# Patient Record
Sex: Male | Born: 1960 | Race: White | Hispanic: No | Marital: Single | Smoking: Current every day smoker
Health system: Southern US, Community
[De-identification: ages and names within clinical notes are randomized; demographics above are authoritative.]

## PROBLEM LIST (undated history)

## (undated) DIAGNOSIS — F329 Major depressive disorder, single episode, unspecified: Secondary | ICD-10-CM

## (undated) DIAGNOSIS — F32A Depression, unspecified: Secondary | ICD-10-CM

## (undated) DIAGNOSIS — B009 Herpesviral infection, unspecified: Secondary | ICD-10-CM

## (undated) DIAGNOSIS — F419 Anxiety disorder, unspecified: Secondary | ICD-10-CM

## (undated) DIAGNOSIS — K219 Gastro-esophageal reflux disease without esophagitis: Secondary | ICD-10-CM

## (undated) HISTORY — DX: Herpesviral infection, unspecified: B00.9

## (undated) HISTORY — PX: TONSILLECTOMY: SUR1361

## (undated) HISTORY — DX: Major depressive disorder, single episode, unspecified: F32.9

## (undated) HISTORY — DX: Depression, unspecified: F32.A

## (undated) HISTORY — DX: Gastro-esophageal reflux disease without esophagitis: K21.9

## (undated) HISTORY — PX: WISDOM TOOTH EXTRACTION: SHX21

## (undated) HISTORY — DX: Anxiety disorder, unspecified: F41.9

---

## 1999-08-10 HISTORY — PX: SHOULDER SURGERY: SHX246

## 2015-02-11 ENCOUNTER — Other Ambulatory Visit (HOSPITAL_COMMUNITY): Payer: Self-pay | Admitting: Orthopedic Surgery

## 2015-02-11 DIAGNOSIS — S335XXA Sprain of ligaments of lumbar spine, initial encounter: Secondary | ICD-10-CM

## 2015-02-14 ENCOUNTER — Emergency Department (HOSPITAL_COMMUNITY): Payer: 59

## 2015-02-14 ENCOUNTER — Inpatient Hospital Stay (HOSPITAL_COMMUNITY)
Admission: EM | Admit: 2015-02-14 | Discharge: 2015-02-17 | DRG: 184 | Disposition: A | Discharge status: Home / Self Care | Payer: 59 | Attending: General Surgery | Admitting: General Surgery

## 2015-02-14 ENCOUNTER — Encounter (HOSPITAL_COMMUNITY): Payer: Self-pay | Admitting: Emergency Medicine

## 2015-02-14 DIAGNOSIS — F17210 Nicotine dependence, cigarettes, uncomplicated: Secondary | ICD-10-CM | POA: Diagnosis present

## 2015-02-14 DIAGNOSIS — S2243XA Multiple fractures of ribs, bilateral, initial encounter for closed fracture: Secondary | ICD-10-CM | POA: Diagnosis not present

## 2015-02-14 DIAGNOSIS — F1110 Opioid abuse, uncomplicated: Secondary | ICD-10-CM | POA: Diagnosis present

## 2015-02-14 DIAGNOSIS — S27322A Contusion of lung, bilateral, initial encounter: Secondary | ICD-10-CM | POA: Diagnosis present

## 2015-02-14 DIAGNOSIS — S2249XA Multiple fractures of ribs, unspecified side, initial encounter for closed fracture: Secondary | ICD-10-CM | POA: Diagnosis present

## 2015-02-14 DIAGNOSIS — S2220XA Unspecified fracture of sternum, initial encounter for closed fracture: Secondary | ICD-10-CM | POA: Diagnosis present

## 2015-02-14 DIAGNOSIS — S27329A Contusion of lung, unspecified, initial encounter: Secondary | ICD-10-CM

## 2015-02-14 DIAGNOSIS — S2232XA Fracture of one rib, left side, initial encounter for closed fracture: Secondary | ICD-10-CM

## 2015-02-14 DIAGNOSIS — R0781 Pleurodynia: Secondary | ICD-10-CM | POA: Diagnosis not present

## 2015-02-14 DIAGNOSIS — F1190 Opioid use, unspecified, uncomplicated: Secondary | ICD-10-CM | POA: Diagnosis present

## 2015-02-14 LAB — CBC
HEMATOCRIT: 44 % (ref 39.0–52.0)
HEMOGLOBIN: 15.1 g/dL (ref 13.0–17.0)
MCH: 31.4 pg (ref 26.0–34.0)
MCHC: 34.3 g/dL (ref 30.0–36.0)
MCV: 91.5 fL (ref 78.0–100.0)
PLATELETS: 164 10*3/uL (ref 150–400)
RBC: 4.81 MIL/uL (ref 4.22–5.81)
RDW: 12.7 % (ref 11.5–15.5)
WBC: 13.5 10*3/uL — ABNORMAL HIGH (ref 4.0–10.5)

## 2015-02-14 LAB — MRSA PCR SCREENING: MRSA by PCR: NEGATIVE

## 2015-02-14 LAB — I-STAT CHEM 8, ED
BUN: 17 mg/dL (ref 6–20)
Calcium, Ion: 1.13 mmol/L (ref 1.12–1.23)
Chloride: 105 mmol/L (ref 101–111)
Creatinine, Ser: 1.2 mg/dL (ref 0.61–1.24)
Glucose, Bld: 120 mg/dL — ABNORMAL HIGH (ref 65–99)
HEMATOCRIT: 46 % (ref 39.0–52.0)
HEMOGLOBIN: 15.6 g/dL (ref 13.0–17.0)
POTASSIUM: 5 mmol/L (ref 3.5–5.1)
Sodium: 141 mmol/L (ref 135–145)
TCO2: 22 mmol/L (ref 0–100)

## 2015-02-14 LAB — PREPARE FRESH FROZEN PLASMA
UNIT DIVISION: 0
Unit division: 0

## 2015-02-14 LAB — TYPE AND SCREEN
ABO/RH(D): A POS
Antibody Screen: NEGATIVE
UNIT DIVISION: 0
Unit division: 0

## 2015-02-14 LAB — COMPREHENSIVE METABOLIC PANEL
ALK PHOS: 132 U/L — AB (ref 38–126)
ALT: 42 U/L (ref 17–63)
AST: 53 U/L — ABNORMAL HIGH (ref 15–41)
Albumin: 3.4 g/dL — ABNORMAL LOW (ref 3.5–5.0)
Anion gap: 6 (ref 5–15)
BUN: 13 mg/dL (ref 6–20)
CALCIUM: 8.7 mg/dL — AB (ref 8.9–10.3)
CHLORIDE: 106 mmol/L (ref 101–111)
CO2: 26 mmol/L (ref 22–32)
Creatinine, Ser: 1.35 mg/dL — ABNORMAL HIGH (ref 0.61–1.24)
GFR calc Af Amer: >60 mL/min (ref >60)
GFR, EST NON AFRICAN AMERICAN: 59 mL/min — AB (ref >60)
GLUCOSE: 123 mg/dL — AB (ref 65–99)
Potassium: 5 mmol/L (ref 3.5–5.1)
Sodium: 138 mmol/L (ref 135–145)
Total Bilirubin: 0.8 mg/dL (ref 0.3–1.2)
Total Protein: 6.8 g/dL (ref 6.5–8.1)

## 2015-02-14 LAB — I-STAT CG4 LACTIC ACID, ED: Lactic Acid, Venous: 1.26 mmol/L (ref 0.5–2.0)

## 2015-02-14 LAB — PROTIME-INR
INR: 1.23 (ref 0.00–1.49)
PROTHROMBIN TIME: 15.6 s — AB (ref 11.6–15.2)

## 2015-02-14 LAB — ETHANOL: Alcohol, Ethyl (B): <5 mg/dL (ref <5)

## 2015-02-14 LAB — ABO/RH: ABO/RH(D): A POS

## 2015-02-14 LAB — CDS SEROLOGY

## 2015-02-14 MED ORDER — PANTOPRAZOLE SODIUM 40 MG PO TBEC
40.0000 mg | DELAYED_RELEASE_TABLET | Freq: Every day | ORAL | Status: DC
  Administered 2015-02-14 – 2015-02-16 (×2): 40 mg via ORAL
  Filled 2015-02-14 (×2): qty 1

## 2015-02-14 MED ORDER — ETOMIDATE 2 MG/ML IV SOLN
INTRAVENOUS | Status: AC
  Filled 2015-02-14: qty 20

## 2015-02-14 MED ORDER — PANTOPRAZOLE SODIUM 40 MG IV SOLR
40.0000 mg | Freq: Every day | INTRAVENOUS | Status: DC
  Administered 2015-02-15: 40 mg via INTRAVENOUS
  Filled 2015-02-14 (×2): qty 40

## 2015-02-14 MED ORDER — FENTANYL CITRATE (PF) 100 MCG/2ML IJ SOLN
INTRAMUSCULAR | Status: DC | PRN
  Administered 2015-02-14: 100 ug via INTRAVENOUS

## 2015-02-14 MED ORDER — ROCURONIUM BROMIDE 50 MG/5ML IV SOLN
INTRAVENOUS | Status: AC
  Filled 2015-02-14: qty 2

## 2015-02-14 MED ORDER — NALOXONE HCL 0.4 MG/ML IJ SOLN: 0.4000 mg | INTRAMUSCULAR | Status: DC | PRN

## 2015-02-14 MED ORDER — OXYCODONE HCL 5 MG PO TABS
5.0000 mg | ORAL_TABLET | ORAL | Status: DC | PRN
  Filled 2015-02-14: qty 1

## 2015-02-14 MED ORDER — OXYCODONE HCL 5 MG PO TABS: 10.0000 mg | ORAL_TABLET | ORAL | Status: DC | PRN

## 2015-02-14 MED ORDER — LIDOCAINE HCL (CARDIAC) 20 MG/ML IV SOLN
INTRAVENOUS | Status: AC
  Filled 2015-02-14: qty 5

## 2015-02-14 MED ORDER — DIPHENHYDRAMINE HCL 12.5 MG/5ML PO ELIX
12.5000 mg | ORAL_SOLUTION | Freq: Four times a day (QID) | ORAL | Status: DC | PRN
  Filled 2015-02-14: qty 5

## 2015-02-14 MED ORDER — SUCCINYLCHOLINE CHLORIDE 20 MG/ML IJ SOLN
INTRAMUSCULAR | Status: AC
  Filled 2015-02-14: qty 1

## 2015-02-14 MED ORDER — DIPHENHYDRAMINE HCL 50 MG/ML IJ SOLN: 12.5000 mg | Freq: Four times a day (QID) | INTRAMUSCULAR | Status: DC | PRN

## 2015-02-14 MED ORDER — HYDROMORPHONE 0.3 MG/ML IV SOLN
INTRAVENOUS | Status: DC
  Administered 2015-02-15: 5.1 mg via INTRAVENOUS
  Administered 2015-02-15: 4.8 mg via INTRAVENOUS
  Administered 2015-02-15: 09:00:00 via INTRAVENOUS
  Administered 2015-02-15: 3.6 mg via INTRAVENOUS
  Administered 2015-02-15: via INTRAVENOUS
  Administered 2015-02-15: 1.5 mg via INTRAVENOUS
  Administered 2015-02-15: 17:00:00 via INTRAVENOUS
  Administered 2015-02-15: 2.7 mg via INTRAVENOUS
  Administered 2015-02-16: 3 mg via INTRAVENOUS
  Administered 2015-02-16: 3.43 mg via INTRAVENOUS
  Administered 2015-02-16: 0.3 mg via INTRAVENOUS
  Administered 2015-02-16: 3 mg via INTRAVENOUS
  Filled 2015-02-14 (×4): qty 25

## 2015-02-14 MED ORDER — SODIUM CHLORIDE 0.9 % IV SOLN
INTRAVENOUS | Status: DC
  Administered 2015-02-14 – 2015-02-15 (×2): via INTRAVENOUS

## 2015-02-14 MED ORDER — SODIUM CHLORIDE 0.9 % IV SOLN
INTRAVENOUS | Status: DC | PRN
  Administered 2015-02-14: 1000 mL via INTRAVENOUS

## 2015-02-14 MED ORDER — ONDANSETRON HCL 4 MG/2ML IJ SOLN: 4.0000 mg | Freq: Four times a day (QID) | INTRAMUSCULAR | Status: DC | PRN

## 2015-02-14 MED ORDER — SODIUM CHLORIDE 0.9 % IJ SOLN: 9.0000 mL | INTRAMUSCULAR | Status: DC | PRN

## 2015-02-14 MED ORDER — FENTANYL CITRATE (PF) 100 MCG/2ML IJ SOLN: 100.0000 ug | Freq: Once | INTRAMUSCULAR | Status: AC

## 2015-02-14 MED ORDER — ENOXAPARIN SODIUM 40 MG/0.4ML ~~LOC~~ SOLN
40.0000 mg | SUBCUTANEOUS | Status: DC
Start: 2015-02-15 — End: 2015-02-15
  Filled 2015-02-14 (×3): qty 0.4
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
## 2015-02-14 NOTE — ED Notes (Signed)
Patient is resting comfortably. 
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
## 2015-02-14 NOTE — Progress Notes (Signed)
Chaplain responded to ED/Trauma B    02/14/15 2000  Clinical Encounter Type  Visited With Family;Health care provider  Visit Type Initial;Spiritual support  Referral From Other (Comment)  Spiritual Encounters  Spiritual Needs Emotional

## 2015-02-14 NOTE — ED Notes (Signed)
Family at bedside. 

## 2015-02-14 NOTE — ED Notes (Signed)
Transported to SDU by Everlena CooperMica, RN
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
## 2015-02-15 ENCOUNTER — Encounter (HOSPITAL_COMMUNITY): Payer: Self-pay | Admitting: Anesthesiology

## 2015-02-15 ENCOUNTER — Encounter (HOSPITAL_COMMUNITY): Payer: Self-pay | Admitting: *Deleted

## 2015-02-15 ENCOUNTER — Inpatient Hospital Stay (HOSPITAL_COMMUNITY): Payer: 59 | Admitting: Anesthesiology

## 2015-02-15 DIAGNOSIS — F1110 Opioid abuse, uncomplicated: Secondary | ICD-10-CM | POA: Diagnosis present

## 2015-02-15 DIAGNOSIS — S27322A Contusion of lung, bilateral, initial encounter: Secondary | ICD-10-CM | POA: Diagnosis present

## 2015-02-15 DIAGNOSIS — F17210 Nicotine dependence, cigarettes, uncomplicated: Secondary | ICD-10-CM | POA: Diagnosis present

## 2015-02-15 DIAGNOSIS — R0781 Pleurodynia: Secondary | ICD-10-CM | POA: Diagnosis present

## 2015-02-15 DIAGNOSIS — S2249XA Multiple fractures of ribs, unspecified side, initial encounter for closed fracture: Secondary | ICD-10-CM | POA: Diagnosis present

## 2015-02-15 DIAGNOSIS — S2243XA Multiple fractures of ribs, bilateral, initial encounter for closed fracture: Secondary | ICD-10-CM | POA: Diagnosis present

## 2015-02-15 DIAGNOSIS — S2220XA Unspecified fracture of sternum, initial encounter for closed fracture: Secondary | ICD-10-CM | POA: Diagnosis present

## 2015-02-15 LAB — BASIC METABOLIC PANEL
Anion gap: 8 (ref 5–15)
BUN: 8 mg/dL (ref 6–20)
CO2: 26 mmol/L (ref 22–32)
CREATININE: 0.95 mg/dL (ref 0.61–1.24)
Calcium: 8.3 mg/dL — ABNORMAL LOW (ref 8.9–10.3)
Chloride: 102 mmol/L (ref 101–111)
GFR calc Af Amer: >60 mL/min (ref >60)
GFR calc non Af Amer: >60 mL/min (ref >60)
Glucose, Bld: 102 mg/dL — ABNORMAL HIGH (ref 65–99)
Potassium: 3.5 mmol/L (ref 3.5–5.1)
Sodium: 136 mmol/L (ref 135–145)

## 2015-02-15 LAB — CBC
HCT: 40.7 % (ref 39.0–52.0)
HEMOGLOBIN: 13.7 g/dL (ref 13.0–17.0)
MCH: 30.9 pg (ref 26.0–34.0)
MCHC: 33.7 g/dL (ref 30.0–36.0)
MCV: 91.9 fL (ref 78.0–100.0)
PLATELETS: 166 10*3/uL (ref 150–400)
RBC: 4.43 MIL/uL (ref 4.22–5.81)
RDW: 13 % (ref 11.5–15.5)
WBC: 10.5 10*3/uL (ref 4.0–10.5)

## 2015-02-15 MED ORDER — KETAMINE HCL 10 MG/ML IJ SOLN
INTRAMUSCULAR | Status: DC | PRN
  Administered 2015-02-15: 20 mg via INTRAVENOUS
  Administered 2015-02-15: 10 mg via INTRAVENOUS

## 2015-02-15 MED ORDER — ROPIVACAINE HCL 2 MG/ML IJ SOLN
8.0000 mL/h | INTRAMUSCULAR | Status: DC
  Administered 2015-02-16: 8 mL/h via EPIDURAL
  Filled 2015-02-15 (×2): qty 200

## 2015-02-15 MED ORDER — PNEUMOCOCCAL VAC POLYVALENT 25 MCG/0.5ML IJ INJ
0.5000 mL | INJECTION | INTRAMUSCULAR | Status: AC
  Administered 2015-02-16: 0.5 mL via INTRAMUSCULAR
  Filled 2015-02-15: qty 0.5

## 2015-02-15 MED ORDER — CETYLPYRIDINIUM CHLORIDE 0.05 % MT LIQD
7.0000 mL | Freq: Two times a day (BID) | OROMUCOSAL | Status: DC
  Administered 2015-02-15 – 2015-02-16 (×2): 7 mL via OROMUCOSAL

## 2015-02-15 MED ORDER — KETAMINE HCL 100 MG/ML IJ SOLN
INTRAMUSCULAR | Status: AC
  Filled 2015-02-15: qty 1

## 2015-02-15 MED ORDER — FENTANYL CITRATE (PF) 100 MCG/2ML IJ SOLN
INTRAMUSCULAR | Status: AC
  Administered 2015-02-15: 100 ug
  Filled 2015-02-15: qty 4

## 2015-02-15 MED ORDER — ROPIVACAINE HCL 2 MG/ML IJ SOLN
6.0000 mL/h | INTRAMUSCULAR | Status: DC
  Administered 2015-02-15: 6 mL/h via EPIDURAL
  Filled 2015-02-15: qty 200

## 2015-02-15 MED ORDER — MIDAZOLAM HCL 2 MG/2ML IJ SOLN
INTRAMUSCULAR | Status: AC
Start: 2015-02-15 — End: 2015-02-15
  Administered 2015-02-15: 2 mg
  Filled 2015-02-15: qty 4

## 2015-02-15 MED ORDER — CHLORHEXIDINE GLUCONATE 0.12 % MT SOLN
15.0000 mL | Freq: Two times a day (BID) | OROMUCOSAL | Status: DC
  Administered 2015-02-15 – 2015-02-16 (×2): 15 mL via OROMUCOSAL
  Filled 2015-02-15 (×4): qty 15

## 2015-02-15 MED ORDER — LIDOCAINE HCL (PF) 1 % IJ SOLN
INTRAMUSCULAR | Status: DC | PRN
  Administered 2015-02-15: 2 mL via EPIDURAL
  Administered 2015-02-15: 5 mL
  Administered 2015-02-15: 3 mL

## 2015-02-15 NOTE — Anesthesia Procedure Notes (Addendum)
Epidural Patient location during procedure: ICU Start time: 02/15/2015 1:30 PM End time: 02/15/2015 1:58 PM  Staffing Anesthesiologist: Marcene DuosFITZGERALD, Pancho Rushing Performed by: anesthesiologist   Preanesthetic Checklist Completed: patient identified, site marked, surgical consent, pre-op evaluation, timeout performed, IV checked, risks and benefits discussed and monitors and equipment checked  Epidural Patient position: right lateral decubitus Prep: DuraPrep Patient monitoring: heart rate, cardiac monitor, continuous pulse ox and blood pressure Approach: right paramedian Location: thoracic (1-12) (T6-T7 interspace) Injection technique: LOR saline  Needle:  Needle type: Tuohy  Needle gauge: 17 G Needle length: 9 cm Needle insertion depth: 5 cm Catheter type: closed end flexible Catheter size: 19 Gauge Catheter at skin depth: 10 cm Test dose: Other (1% Lidocaine)  Assessment Events: blood not aspirated  Additional Notes Risks and benefits discussed with pt. Pt prepped and draped in sterile fashion. Skin anesthetized with 2% lidocaine and touhy advanced and lamina encountered at 3cm. Needle advanced and LOR at 5cm. Catheter threaded easily to 10cm at skin. Negative SA test dose with 2cc's 1% lidocaine. After five minutes pt able to move BLE and arms and additional 5cc's injected for negative IV test dose. Pt tolerated procedure well with no immediate complications.Reason for block:procedure for pain
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
## 2015-02-15 NOTE — Progress Notes (Signed)
Pt transferred from the ED around 2145, admitted to Rm/3s10. Pt is alert and oriented x4 but very drowsy. Fiine red rash noted to bilateral upper and lower extremities also has abrasion to left forearm dressed with Tegaderm. Placed on telemetry, currently NSR. Oriented to room, instructed to call for assistance before getting out of bed. Bed alarm on, educated about use of PCA at time of setup. Resting comfortably at this time, will continue to monitor

## 2015-02-15 NOTE — Progress Notes (Signed)
PT Cancellation Note  Patient Details Name: Carlos Johnson MRN: [id number] DOB: [birth year]   Cancelled Treatment:    Reason Eval/Treat Not Completed: Medical issues which prohibited therapy (Pt in pain.  For epidural this am per nurse/pt. Will check back in am.)   [name], Carlos Johnson 02/15/2015, 10:33 AM Eber Jonesawn Tamila Gaulin,PT Acute Rehabilitation [phone] (pager)

## 2015-02-16 ENCOUNTER — Inpatient Hospital Stay (HOSPITAL_COMMUNITY): Payer: 59

## 2015-02-16 MED ORDER — WHITE PETROLATUM GEL
Status: AC
  Administered 2015-02-16: 0.2
  Filled 2015-02-16: qty 1

## 2015-02-16 MED ORDER — OXYCODONE HCL 5 MG PO TABS
20.0000 mg | ORAL_TABLET | ORAL | Status: DC | PRN
Start: 2015-02-16 — End: 2015-02-17
  Administered 2015-02-16 – 2015-02-17 (×6): 20 mg via ORAL
  Filled 2015-02-16 (×6): qty 4

## 2015-02-16 MED ORDER — KETOROLAC TROMETHAMINE 30 MG/ML IJ SOLN
30.0000 mg | Freq: Once | INTRAMUSCULAR | Status: AC
  Administered 2015-02-16: 30 mg via INTRAVENOUS

## 2015-02-16 MED ORDER — KETOROLAC TROMETHAMINE 15 MG/ML IJ SOLN
15.0000 mg | Freq: Four times a day (QID) | INTRAMUSCULAR | Status: DC
  Administered 2015-02-16 – 2015-02-17 (×3): 15 mg via INTRAVENOUS
  Filled 2015-02-16 (×3): qty 1

## 2015-02-16 MED ORDER — KETOROLAC TROMETHAMINE 30 MG/ML IJ SOLN
INTRAMUSCULAR | Status: AC
  Filled 2015-02-16: qty 1

## 2015-02-16 MED ORDER — HYDROMORPHONE HCL 1 MG/ML IJ SOLN
0.5000 mg | INTRAMUSCULAR | Status: DC | PRN
  Administered 2015-02-16: 2 mg via INTRAVENOUS
  Administered 2015-02-16: 1 mg via INTRAVENOUS
  Administered 2015-02-17 (×2): 2 mg via INTRAVENOUS
  Filled 2015-02-16 (×2): qty 2
  Filled 2015-02-16: qty 1
  Filled 2015-02-16: qty 2

## 2015-02-16 MED ORDER — NICOTINE 21 MG/24HR TD PT24
21.0000 mg | MEDICATED_PATCH | TRANSDERMAL | Status: DC
  Administered 2015-02-16: 21 mg via TRANSDERMAL
  Filled 2015-02-16: qty 1

## 2015-02-16 NOTE — Progress Notes (Signed)
Called around 6 pm to report that epidural had come apart at the tubing connection.  I was unable to get to the room until now but was able to DC it as it was essentially hanging in the subq tissue. Band Aid placed. Tolerated well.
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
## 2015-02-16 NOTE — Progress Notes (Addendum)
Report called to Woodland Heightshristy, RN on 6N. Patient to be transferred to 6N12 on tele via bed by Jennette Kettleeanna, RN and [place], [place]. Belongings sent with patient. Per patient's request, patient's sister, Pervis HockingSusan Smith, notified of move to new room.

## 2015-02-16 NOTE — Progress Notes (Signed)
Dilaudid PCA discontinued per MD order.

## 2015-02-16 NOTE — Progress Notes (Signed)
While assessing patient at 2015, found that patient's epidural catheter was disconnected from the medication tubing.  Dr. Ivin Bootyrews notified.  He will be up to see patient
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
## 2015-02-17 ENCOUNTER — Encounter: Payer: Self-pay | Admitting: Orthopedic Surgery

## 2015-02-17 DIAGNOSIS — S27322A Contusion of lung, bilateral, initial encounter: Secondary | ICD-10-CM | POA: Diagnosis present

## 2015-02-17 DIAGNOSIS — S2220XA Unspecified fracture of sternum, initial encounter for closed fracture: Secondary | ICD-10-CM | POA: Diagnosis present

## 2015-02-17 DIAGNOSIS — F1190 Opioid use, unspecified, uncomplicated: Secondary | ICD-10-CM | POA: Diagnosis present

## 2015-02-17 LAB — BLOOD PRODUCT ORDER (VERBAL) VERIFICATION

## 2015-02-17 MED ORDER — HYDROMORPHONE HCL 1 MG/ML IJ SOLN
0.5000 mg | INTRAMUSCULAR | Status: DC | PRN
Start: 2015-02-17 — End: 2015-02-17

## 2015-02-17 MED ORDER — NAPROXEN 500 MG PO TABS: 500.0000 mg | ORAL_TABLET | Freq: Two times a day (BID) | ORAL | Status: DC

## 2015-02-17 MED ORDER — TRAMADOL HCL 50 MG PO TABS
100.0000 mg | ORAL_TABLET | Freq: Four times a day (QID) | ORAL | Status: DC
  Administered 2015-02-17 (×3): 100 mg via ORAL
  Filled 2015-02-17 (×3): qty 2

## 2015-02-17 MED ORDER — IOHEXOL 300 MG/ML  SOLN
100.0000 mL | Freq: Once | INTRAMUSCULAR | Status: AC | PRN
  Administered 2015-02-17: 100 mL via INTRAVENOUS

## 2015-02-17 MED ORDER — TRAMADOL HCL 50 MG PO TABS: 100.0000 mg | ORAL_TABLET | Freq: Four times a day (QID) | ORAL | Status: DC

## 2015-02-17 MED ORDER — OXYCODONE-ACETAMINOPHEN 10-325 MG PO TABS: 1.0000 | ORAL_TABLET | ORAL | Status: DC | PRN

## 2015-02-17 MED ORDER — NAPROXEN 250 MG PO TABS
500.0000 mg | ORAL_TABLET | Freq: Two times a day (BID) | ORAL | Status: DC
  Administered 2015-02-17 (×2): 500 mg via ORAL
  Filled 2015-02-17 (×2): qty 2
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
## 2015-02-17 NOTE — Discharge Instructions (Signed)
Increase activity as pain allows. ° °No driving while taking oxycodone. °
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
## 2015-02-17 NOTE — Progress Notes (Signed)
Paged Dr. Lindie SpruceWyatt last night around 2200 because patient complaining of pain unrelieved by 2 mg Dilaudid given at 2017 and 20 mg Oxycodone at 2139.  No new orders at this time.

## 2015-02-17 NOTE — Progress Notes (Signed)
Carlos Johnson to be D/C'd Home per MD order.  Discussed with the patient and all questions fully answered.  VSS,IV catheters discontinued intact. Site without signs and symptoms of complications. Dressing and pressure applied.  An After Visit Summary was printed and given to the patient. Patient received prescription. Smoking cessation information given. Pt reports he is not wanting to stop smoking at this time.   D/c education completed with patient including follow up instructions, medication list, d/c activities limitations if indicated, with other d/c instructions as indicated by MD - patient able to verbalize understanding, all questions fully answered.   Patient instructed to return to ED, call 911, or call MD for any changes in condition.   Patient is waiting on a ride home and will be discharged home once someone is available. Pt has spoken to his mother and a friend who is working things out about his transportation.    Sherene Siresillman, Anetra Czerwinski Jo 02/17/2015 5:16 PM
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
## 2015-02-19 ENCOUNTER — Ambulatory Visit (HOSPITAL_COMMUNITY): Payer: 59

## 2015-02-27 ENCOUNTER — Other Ambulatory Visit (HOSPITAL_COMMUNITY): Payer: Self-pay

## 2015-02-28 ENCOUNTER — Ambulatory Visit (HOSPITAL_COMMUNITY): Payer: 59

## 2015-02-28 MED ORDER — OXYCODONE-ACETAMINOPHEN 10-325 MG PO TABS: 1.0000 | ORAL_TABLET | ORAL | Status: DC | PRN

## 2015-02-28 NOTE — Telephone Encounter (Signed)
Pt called for more pain medication, said he had to go to ED at [facility] due to spider bite and used medicine more quickly. I did confirm that he was seen twice at [facility] ED. Will write for Perc 10/325, #20.

## 2015-03-01 ENCOUNTER — Encounter: Payer: Self-pay | Admitting: General Surgery
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
## 2015-03-02 ENCOUNTER — Emergency Department (INDEPENDENT_AMBULATORY_CARE_PROVIDER_SITE_OTHER)
Admission: EM | Admit: 2015-03-02 | Discharge: 2015-03-02 | Disposition: A | Discharge status: Home / Self Care | Payer: 59 | Source: Home / Self Care | Attending: Emergency Medicine | Admitting: Emergency Medicine

## 2015-03-02 ENCOUNTER — Ambulatory Visit: Payer: 59

## 2015-03-02 ENCOUNTER — Encounter (HOSPITAL_COMMUNITY): Payer: Self-pay | Admitting: Emergency Medicine

## 2015-03-02 DIAGNOSIS — S2239XA Fracture of one rib, unspecified side, initial encounter for closed fracture: Secondary | ICD-10-CM

## 2015-03-02 DIAGNOSIS — Z5189 Encounter for other specified aftercare: Secondary | ICD-10-CM | POA: Diagnosis not present

## 2015-03-02 MED ORDER — OXYCODONE-ACETAMINOPHEN 10-325 MG PO TABS: 1.0000 | ORAL_TABLET | ORAL | Status: DC | PRN

## 2015-03-02 NOTE — Discharge Instructions (Signed)
Use the pain medicine every 4 hours as needed. Follow-up with [facility] or your PCP for additional refills.  Do wet to dry dressings on your hand twice a day. There is no sign of infection currently. You do not need to use any creams. Follow-up with the wound care center as scheduled next month. If things are getting worse, please go to the emergency room.
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
## 2015-09-03 ENCOUNTER — Encounter (HOSPITAL_COMMUNITY): Payer: Self-pay | Admitting: *Deleted

## 2015-09-03 ENCOUNTER — Emergency Department (HOSPITAL_COMMUNITY)
Admission: EM | Admit: 2015-09-03 | Discharge: 2015-09-03 | Disposition: A | Discharge status: Home / Self Care | Payer: Self-pay | Attending: Emergency Medicine | Admitting: Emergency Medicine

## 2015-09-03 DIAGNOSIS — Y9389 Activity, other specified: Secondary | ICD-10-CM | POA: Insufficient documentation

## 2015-09-03 DIAGNOSIS — F17210 Nicotine dependence, cigarettes, uncomplicated: Secondary | ICD-10-CM | POA: Insufficient documentation

## 2015-09-03 DIAGNOSIS — Y9289 Other specified places as the place of occurrence of the external cause: Secondary | ICD-10-CM | POA: Insufficient documentation

## 2015-09-03 DIAGNOSIS — Y998 Other external cause status: Secondary | ICD-10-CM | POA: Insufficient documentation

## 2015-09-03 DIAGNOSIS — X58XXXA Exposure to other specified factors, initial encounter: Secondary | ICD-10-CM | POA: Insufficient documentation

## 2015-09-03 DIAGNOSIS — T401X1A Poisoning by heroin, accidental (unintentional), initial encounter: Secondary | ICD-10-CM | POA: Insufficient documentation

## 2015-09-03 LAB — CBC WITH DIFFERENTIAL/PLATELET
Basophils Absolute: 0 10*3/uL (ref 0.0–0.1)
Basophils Relative: 0 %
Eosinophils Absolute: 0.3 10*3/uL (ref 0.0–0.7)
Eosinophils Relative: 3 %
HEMATOCRIT: 45.5 % (ref 39.0–52.0)
Hemoglobin: 15.6 g/dL (ref 13.0–17.0)
LYMPHS PCT: 36 %
Lymphs Abs: 3.8 10*3/uL (ref 0.7–4.0)
MCH: 31.3 pg (ref 26.0–34.0)
MCHC: 34.3 g/dL (ref 30.0–36.0)
MCV: 91.4 fL (ref 78.0–100.0)
MONOS PCT: 9 %
Monocytes Absolute: 1 10*3/uL (ref 0.1–1.0)
NEUTROS ABS: 5.5 10*3/uL (ref 1.7–7.7)
Neutrophils Relative %: 52 %
Platelets: 255 10*3/uL (ref 150–400)
RBC: 4.98 MIL/uL (ref 4.22–5.81)
RDW: 13.6 % (ref 11.5–15.5)
WBC: 10.6 10*3/uL — ABNORMAL HIGH (ref 4.0–10.5)

## 2015-09-03 LAB — BASIC METABOLIC PANEL
ANION GAP: 13 (ref 5–15)
BUN: 9 mg/dL (ref 6–20)
CALCIUM: 9.1 mg/dL (ref 8.9–10.3)
CO2: 22 mmol/L (ref 22–32)
Chloride: 101 mmol/L (ref 101–111)
Creatinine, Ser: 0.94 mg/dL (ref 0.61–1.24)
GFR calc Af Amer: >60 mL/min (ref >60)
GFR calc non Af Amer: >60 mL/min (ref >60)
GLUCOSE: 112 mg/dL — AB (ref 65–99)
Potassium: 3.4 mmol/L — ABNORMAL LOW (ref 3.5–5.1)
Sodium: 136 mmol/L (ref 135–145)

## 2015-09-03 LAB — CBG MONITORING, ED: Glucose-Capillary: 112 mg/dL — ABNORMAL HIGH (ref 65–99)

## 2015-09-03 MED ORDER — POTASSIUM CHLORIDE CRYS ER 20 MEQ PO TBCR
30.0000 meq | EXTENDED_RELEASE_TABLET | Freq: Once | ORAL | Status: AC
  Administered 2015-09-03: 30 meq via ORAL
  Filled 2015-09-03: qty 2
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
## 2015-09-03 NOTE — ED Notes (Signed)
Patient removed nasal airway himself.

## 2015-09-03 NOTE — Discharge Instructions (Signed)
Accidental Overdose °A drug overdose occurs when a chemical substance (drug or medication) is used in amounts large enough to overcome a person. This may result in severe illness or death. This is a type of poisoning. Accidental overdoses of medications or other substances come from a variety of reasons. When this happens accidentally, it is often because the person taking the substance does not know enough about what they have taken. Drugs which commonly cause overdose deaths are alcohol, psychotropic medications (medications which affect the mind), pain medications, illegal drugs (street drugs) such as cocaine and heroin, and multiple drugs taken at the same time. It may result from careless behavior (such as over-indulging at a party). Other causes of overdose may include multiple drug use, a lapse in memory, or drug use after a period of no drug use.  °Sometimes overdosing occurs because a person cannot remember if they have taken their medication.  °A common unintentional overdose in young children involves multi-vitamins containing iron. Iron is a part of the hemoglobin molecule in blood. It is used to transport oxygen to living cells. When taken in small amounts, iron allows the body to restock hemoglobin. In large amounts, it causes problems in the body. If this overdose is not treated, it can lead to death. °Never take medicines that show signs of tampering or do not seem quite right. Never take medicines in the dark or in poor lighting. Read the label and check each dose of medicine before you take it. When adults are poisoned, it happens most often through carelessness or lack of information. Taking medicines in the dark or taking medicine prescribed for someone else to treat the same type of problem is a dangerous practice. °SYMPTOMS  °Symptoms of overdose depend on the medication and amount taken. They can vary from over-activity with stimulant over-dosage, to sleepiness from depressants such as  alcohol, narcotics and tranquilizers. Confusion, dizziness, nausea and vomiting may be present. If problems are severe enough coma and death may result. °DIAGNOSIS  °Diagnosis and management are generally straightforward if the drug is known. Otherwise it is more difficult. At times, certain symptoms and signs exhibited by the patient, or blood tests, can reveal the drug in question.  °TREATMENT  °In an emergency department, most patients can be treated with supportive measures. Antidotes may be available if there has been an overdose of opioids or benzodiazepines. A rapid improvement will often occur if this is the cause of overdose. °At home or away from medical care: °· There may be no immediate problems or warning signs in children. °· Not everything works well in all cases of poisoning. °· Take immediate action. Poisons may act quickly. °· If you think someone has swallowed medicine or a household product, and the person is unconscious, having seizures (convulsions), or is not breathing, immediately call for an ambulance. °IF a person is conscious and appears to be doing OK but has swallowed a poison: °· Do not wait to see what effect the poison will have. Immediately call a poison control center (listed in the white pages of your telephone book under "Poison Control" or inside the front cover with other emergency numbers). Some poison control centers have TTY capability for the deaf. Check with your local center if you or someone in your family requires this service. °· Keep the container so you can read the label on the product for ingredients. °· Describe what, when, and how much was taken and the age and condition of the person poisoned.   Inform them if the person is vomiting, choking, drowsy, shows a change in color or temperature of skin, is conscious or unconscious, or is convulsing. °· Do not cause vomiting unless instructed by medical personnel. Do not induce vomiting or force liquids into a person who  is convulsing, unconscious, or very drowsy. °Stay calm and in control.  °· Activated charcoal also is sometimes used in certain types of poisoning and you may wish to add a supply to your emergency medicines. It is available without a prescription. Call a poison control center before using this medication. °PREVENTION  °Thousands of children die every year from unintentional poisoning. This may be from household chemicals, poisoning from carbon monoxide in a car, taking their parent's medications, or simply taking a few iron pills or vitamins with iron. Poisoning comes from unexpected sources. °· Store medicines out of the sight and reach of children, preferably in a locked cabinet. Do not keep medications in a food cabinet. Always store your medicines in a secure place. Get rid of expired medications. °· If you have children living with you or have them as occasional guests, you should have child-resistant caps on your medicine containers. Keep everything out of reach. Child proof your home. °· If you are called to the telephone or to answer the door while you are taking a medicine, take the container with you or put the medicine out of the reach of small children. °· Do not take your medication in front of children. Do not tell your child how good a medication is and how good it is for them. They may get the idea it is more of a treat. °· If you are an adult and have accidentally taken an overdose, you need to consider how this happened and what can be done to prevent it from happening again. If this was from a street drug or alcohol, determine if there is a problem that needs addressing. If you are not sure a problems exists, it is easy to talk to a professional and ask them if they think you have a problem. It is better to handle this problem in this way before it happens again and has a much worse consequence. °  °This information is not intended to replace advice given to you by your health care provider. Make  sure you discuss any questions you have with your health care provider. °  °Document Released: 10/09/2004 Document Revised: 08/16/2014 Document Reviewed: 01/13/2015 °Elsevier Interactive Patient Education ©2016 Elsevier Inc. ° °Narcotic Overdose °A narcotic overdose is the misuse or overuse of a narcotic drug. A narcotic overdose can make you pass out and stop breathing. If you are not treated right away, this can cause permanent brain damage or stop your heart. Medicine may be given to reverse the effects of an overdose. If so, this medicine may bring on withdrawal symptoms. The symptoms may be abdominal cramps, throwing up (vomiting), sweating, chills, and nervousness. °Injecting narcotics can cause more problems than just an overdose. AIDS, hepatitis, and other very serious infections are transmitted by sharing needles and syringes. If you decide to quit using, there are medicines which can help you through the withdrawal period. Trying to quit all at once on your own can be uncomfortable, but not life-threatening. Call your caregiver, Narcotics Anonymous, or any drug and alcohol treatment program for further help.  °  °This information is not intended to replace advice given to you by your health care provider. Make sure you discuss any questions   you have with your health care provider. °  °Document Released: 09/02/2004 Document Revised: 08/16/2014 Document Reviewed: 01/15/2015 °Elsevier Interactive Patient Education ©2016 Elsevier Inc. ° °
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
## 2017-08-15 ENCOUNTER — Encounter: Payer: Self-pay | Admitting: Family Medicine

## 2017-08-15 ENCOUNTER — Ambulatory Visit (INDEPENDENT_AMBULATORY_CARE_PROVIDER_SITE_OTHER): Payer: BLUE CROSS/BLUE SHIELD | Admitting: Family Medicine

## 2017-08-15 VITALS — BP 125/82 | HR 90 | Temp 99.1°F | Resp 12 | Ht 71.0 in | Wt 278.1 lb

## 2017-08-15 DIAGNOSIS — A6000 Herpesviral infection of urogenital system, unspecified: Secondary | ICD-10-CM

## 2017-08-15 DIAGNOSIS — L304 Erythema intertrigo: Secondary | ICD-10-CM | POA: Insufficient documentation

## 2017-08-15 DIAGNOSIS — K219 Gastro-esophageal reflux disease without esophagitis: Secondary | ICD-10-CM | POA: Diagnosis not present

## 2017-08-15 DIAGNOSIS — R739 Hyperglycemia, unspecified: Secondary | ICD-10-CM | POA: Diagnosis not present

## 2017-08-15 DIAGNOSIS — G894 Chronic pain syndrome: Secondary | ICD-10-CM | POA: Diagnosis not present

## 2017-08-15 LAB — HEMOGLOBIN A1C: HEMOGLOBIN A1C: 5.9 % (ref 4.6–6.5)

## 2017-08-15 LAB — BASIC METABOLIC PANEL
BUN: 13 mg/dL (ref 6–23)
CHLORIDE: 106 meq/L (ref 96–112)
CO2: 25 meq/L (ref 19–32)
CREATININE: 0.85 mg/dL (ref 0.40–1.50)
Calcium: 9.4 mg/dL (ref 8.4–10.5)
GFR: 99.79 mL/min (ref >60.00)
Glucose, Bld: 127 mg/dL — ABNORMAL HIGH (ref 70–99)
POTASSIUM: 3.5 meq/L (ref 3.5–5.1)
SODIUM: 140 meq/L (ref 135–145)

## 2017-08-15 MED ORDER — ACYCLOVIR 400 MG PO TABS: 400.0000 mg | ORAL_TABLET | Freq: Two times a day (BID) | ORAL | 3 refills | Status: DC

## 2017-08-15 MED ORDER — OMEPRAZOLE 20 MG PO CPDR: 20.0000 mg | DELAYED_RELEASE_CAPSULE | Freq: Every day | ORAL | 4 refills | Status: DC

## 2017-08-15 MED ORDER — CELECOXIB 100 MG PO CAPS: 100.0000 mg | ORAL_CAPSULE | Freq: Two times a day (BID) | ORAL | 1 refills | Status: DC

## 2017-08-15 MED ORDER — NYSTATIN 100000 UNIT/GM EX CREA: 1.0000 "application " | TOPICAL_CREAM | Freq: Two times a day (BID) | CUTANEOUS | 1 refills | Status: DC

## 2017-08-15 NOTE — Progress Notes (Signed)
HPI:   Carlos Johnson is a 57 y.o. male, who is here today to establish care.  Former PCP: DrGolding Last preventive routine visit: A few years ago.  Chronic medical problems: Chronic shoulder pain, recurrent genital herpes,tobacco use disorder, and per records heroin use. He denies history of diabetes, noted mildly elevated glucose at 08/2015, 112.   Recurrent genital herpes, since 1988. He is on viral suppression therapy, Zovirax 400 mg bid. He still has outbreaks about 2-3 times per year but mild.    Concerns today:   Surgery right shoulder in 2002.  Hx of chronic right shoulder pain, he was with pain management 16-17 months ago.. Pain is "unbearable." Reporting "pinched nerve" right should. 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 Pain is 10/10, no radiated.  Limitation of movement at the end of the day.  He takes Ibuprofen 800 mg tid. Requesting Rx for Oxycodone.  Lab Results  Component Value Date   CREATININE 0.94 09/03/2015   BUN 9 09/03/2015   NA 136 09/03/2015   K 3.4 (L) 09/03/2015   CL 101 09/03/2015   CO2 22 09/03/2015    GERD:  + Heartburn. Exacerbated by any food intake. Takes OTC Rollers  Denies abdominal pain, nausea, vomiting, changes in bowel habits, blood in stool or melena. He has not had colonoscopy done, he is not interested in doing so.  He is also requesting topical treatment for erythematosus rash in groin area, which he has had intermittently for years.  Right now he is "not very" symptomatic, former PCP has prescribed topical medication, he does not remember the name of medication.   Review of Systems  Constitutional: Negative for activity change, appetite change, fatigue, fever and unexpected weight change.  HENT: Negative for nosebleeds, sore throat and trouble swallowing.   Eyes: Negative for redness and visual disturbance.  Respiratory: Negative for apnea, cough, shortness of breath and  wheezing.   Cardiovascular: Negative for chest pain, palpitations and leg swelling.  Gastrointestinal: Negative for abdominal pain, blood in stool, nausea and vomiting.       No changes in bowel habits.  Endocrine: Negative for polydipsia, polyphagia and polyuria.  Genitourinary: Negative for decreased urine volume, dysuria and hematuria.  Musculoskeletal: Positive for arthralgias. Negative for gait problem.  Skin: Positive for rash. Negative for wound.  Neurological: Negative for syncope, weakness, numbness and headaches.  Hematological: Negative for adenopathy. Does not bruise/bleed easily.  Psychiatric/Behavioral: Negative for confusion. The patient is nervous/anxious.       No current outpatient medications on file prior to visit.   No current facility-administered medications on file prior to visit.      Past Medical History:  Diagnosis Date  . GERD (gastroesophageal reflux disease)    No Known Allergies  Family History  Problem Relation Age of Onset  . Asthma Mother   . Asthma Daughter     Social History   Socioeconomic History  . Marital status: Unknown    Spouse name: None  . Number of children: None  . Years of education: None  . Highest education level: None  Social Needs  . Financial resource strain: None  . Food insecurity - worry: None  . Food insecurity - inability: None  . Transportation needs - medical: None  . Transportation needs - non-medical: None  Occupational History  . None  Tobacco Use  . Smoking status: Current Every Day Smoker    Packs/day: 1.00    Years:  35.00    Pack years: 35.00    Types: Cigarettes  . Smokeless tobacco: Never Used  Substance and Sexual Activity  . Alcohol use: Yes    Alcohol/week: 12.6 oz    Types: 21 Cans of beer per week    Comment: 2-3 beers per day  . Drug use: Yes    Types: Oxycodone, Hydrocodone, Heroin    Comment: Heroin  . Sexual activity: None  Other Topics Concern  . None  Social History  Narrative  . None    Vitals:   08/15/17 1138  BP: 125/82  Pulse: 90  Resp: 12  Temp: 99.1 F (37.3 C)  SpO2: 98%    Body mass index is 38.79 kg/m.   Physical Exam  Nursing note and vitals reviewed. Constitutional: He is oriented to person, place, and time. He appears well-developed. No distress.  HENT:  Head: Normocephalic and atraumatic.  Mouth/Throat: Oropharynx is clear and moist and mucous membranes are normal.  Eyes: Conjunctivae are normal. Pupils are equal, round, and reactive to light.  Cardiovascular: Normal rate and regular rhythm.  No murmur heard. Pulses:      Dorsalis pedis pulses are 2+ on the right side, and 2+ on the left side.  Respiratory: Effort normal and breath sounds normal. No respiratory distress.  GI: Soft. He exhibits no mass. There is no hepatomegaly. There is no tenderness.  Musculoskeletal: He exhibits no edema or tenderness.       Right shoulder: He exhibits no bony tenderness.       Cervical back: He exhibits no tenderness and no bony tenderness.  Right shoulder mild muscle atrophy, mild limitation of active ROM,pain elicited. No pain upon palpation, no erythema or effusion.  Lymphadenopathy:    He has no cervical adenopathy.  Neurological: He is alert and oriented to person, place, and time. He has normal strength. Gait normal.  Skin: Skin is warm. Rash noted. Rash is macular. No erythema.  Macular erythematous rash in groin, bilateral.  Psychiatric: His mood appears anxious. Cognition and memory are normal.  Fairly groomed, good eye contact.     ASSESSMENT AND PLAN:   Carlos Johnson was seen today for establish care.  Diagnoses and all orders for this visit:  Lab Results  Component Value Date   CREATININE 0.85 08/15/2017   BUN 13 08/15/2017   NA 140 08/15/2017   K 3.5 08/15/2017   CL 106 08/15/2017   CO2 25 08/15/2017   Lab Results  Component Value Date   HGBA1C 5.9 08/15/2017    Gastroesophageal reflux disease, esophagitis  presence not specified  GERD precautions discussed. He agrees with trying Omeprazole. Instructed about warning signs. F/U in 4 months.  -     omeprazole (PRILOSEC) 20 MG capsule; Take 1 capsule (20 mg total) by mouth daily.  Chronic pain syndrome  Referral to pain management placed. Celebrex side effects discussed.  -     Ambulatory referral to Pain Clinic -     celecoxib (CELEBREX) 100 MG capsule; Take 1 capsule (100 mg total) by mouth 2 (two) times daily.  Intertrigo  Educated about Dx,prognosis,and treatment. Topical nystatin cream twice daily as needed recommended. Instructed to try to keep area dry. For signs of bacterial infection. Follow-up as needed.  -     nystatin cream (MYCOSTATIN); Apply 1 application topically 2 (two) times daily.  Hyperglycemia  Further recommendations will be given according to lab results.  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       Betty G. [place], MD  [facility] [place]. [place] office.

## 2017-08-15 NOTE — Patient Instructions (Addendum)
A few things to remember from today's visit:   Hyperglycemia - Plan: Basic metabolic panel, Hemoglobin A1c  Chronic pain syndrome - Plan: Ambulatory referral to Pain Clinic, celecoxib (CELEBREX) 100 MG capsule  Intertrigo - Plan: nystatin cream (MYCOSTATIN)  Gastroesophageal reflux disease, esophagitis presence not specified - Plan: omeprazole (PRILOSEC) 20 MG capsule  Colonoscopy needed.  Pain management to continue through pain clinic.  GERD:  Avoid foods that make your symptoms worse, for example coffee, chocolate,pepermeint,alcohol, and greasy food. Raising the head of your bed about 6 inches may help with nocturnal symptoms.  Avoid tobacco use. Weight loss (if you are overweight). Avoid lying down for 3 hours after eating.  Instead 3 large meals daily try small and more frequent meals during the day.  Every medication have side effects and medications for GERD are not the exception.At this time I think benefit is greater than risk.    You should be evaluated immediately if bloody vomiting, bloody stools, black stools (like tar), difficulty swallowing, food gets stuck on the way down or choking when eating. Abnormal weight loss or severe abdominal pain.  If symptoms are not resolved sometimes endoscopy is necessary.  Please be sure medication list is accurate. If a new problem present, please set up appointment sooner than planned today.

## 2017-08-16 ENCOUNTER — Ambulatory Visit: Payer: Self-pay | Admitting: Family Medicine

## 2017-08-20 ENCOUNTER — Encounter: Payer: Self-pay | Admitting: Family Medicine

## 2017-08-23 ENCOUNTER — Telehealth: Payer: Self-pay | Admitting: *Deleted

## 2017-08-23 NOTE — Telephone Encounter (Signed)
Copied from CRM [id number]. Topic: Referral - Request >> Aug 22, 2017  5:03 PM [place], [place], [place] wrote: Reason for HQI:[id number]:Patient called because he would like a referral to a Pain Management Clinic as long as its not the [facility]. If someone could give him a call back about this at [phone]

## 2017-08-23 NOTE — Telephone Encounter (Signed)
I already placed referral and during OV he told me he wanted same provider he saw before. Can you please send referral to ma different provider. I do not think another referral is necessary.  Thanks, BJ

## 2017-08-24 NOTE — Telephone Encounter (Signed)
Patient would like a different clinic than the [facility], because they keep changing appointments without contacting him.  So he wants a different clinic.  Please give him a call back about this at [phone].

## 2017-09-30 NOTE — Telephone Encounter (Signed)
Relation to ZO:XWRUpt:self  Call back number: [phone]   Reason for call:  Patient states he apologies for the confusion but he would like referral placed with [facility], please adivse

## 2017-10-03 NOTE — Telephone Encounter (Signed)
It is [facility], [place] to,place referral as requested by pt. I though I placed referral during OV, so do we need to place another referral?  Thanks, BJ

## 2017-10-04 NOTE — Telephone Encounter (Signed)
Referral being processed by Shelbie Proctoreborah Dawkins, do not need a new referral placed per Gavin Poundeborah.

## 2018-02-08 ENCOUNTER — Ambulatory Visit: Payer: Self-pay | Admitting: Physician Assistant

## 2018-02-08 ENCOUNTER — Encounter: Payer: Self-pay | Admitting: Physician Assistant

## 2018-02-08 VITALS — BP 146/91 | HR 84 | Temp 97.8°F | Ht 68.0 in | Wt 212.0 lb

## 2018-02-08 DIAGNOSIS — R030 Elevated blood-pressure reading, without diagnosis of hypertension: Secondary | ICD-10-CM

## 2018-02-08 DIAGNOSIS — A6000 Herpesviral infection of urogenital system, unspecified: Secondary | ICD-10-CM

## 2018-02-08 DIAGNOSIS — S81801A Unspecified open wound, right lower leg, initial encounter: Secondary | ICD-10-CM

## 2018-02-08 DIAGNOSIS — S82831D Other fracture of upper and lower end of right fibula, subsequent encounter for closed fracture with routine healing: Secondary | ICD-10-CM

## 2018-02-08 DIAGNOSIS — Z125 Encounter for screening for malignant neoplasm of prostate: Secondary | ICD-10-CM

## 2018-02-08 DIAGNOSIS — Z13220 Encounter for screening for lipoid disorders: Secondary | ICD-10-CM

## 2018-02-08 DIAGNOSIS — K219 Gastro-esophageal reflux disease without esophagitis: Secondary | ICD-10-CM

## 2018-02-08 DIAGNOSIS — F1911 Other psychoactive substance abuse, in remission: Secondary | ICD-10-CM

## 2018-02-08 DIAGNOSIS — F17210 Nicotine dependence, cigarettes, uncomplicated: Secondary | ICD-10-CM

## 2018-02-08 DIAGNOSIS — Z7689 Persons encountering health services in other specified circumstances: Secondary | ICD-10-CM

## 2018-02-08 MED ORDER — OMEPRAZOLE 20 MG PO CPDR: 20.0000 mg | DELAYED_RELEASE_CAPSULE | Freq: Every day | ORAL | 1 refills | Status: DC

## 2018-02-08 MED ORDER — ACYCLOVIR 400 MG PO TABS: 400.0000 mg | ORAL_TABLET | Freq: Two times a day (BID) | ORAL | 1 refills | Status: DC

## 2018-02-08 NOTE — Progress Notes (Signed)
BP (!) 146/91 (BP Location: Left Arm, Patient Position: Sitting, Cuff Size: Normal)   Pulse 84   Temp 97.8 F (36.6 C)   Ht 5\' 8"  (1.727 m)   Wt 212 lb (96.2 kg)   SpO2 98%   BMI 32.23 kg/m    Subjective:    Patient ID: Carlos Johnson, male    DOB: [birth year], 57 y.o.   MRN: [id number]  HPI: Carlos Johnson is a 57 y.o. male presenting on 02/08/2018 for New Patient (Initial Visit)   HPI  Chief Complaint  Patient presents with  . New Patient (Initial Visit)    Pt says he went to orthopedist (dr Orson AloeHenderson) in [place] the day after he was diagnosed with fracture fibula.  [facility].   He has appointment to follow up July 5.  He says he plans to cancel the appointment due to not having any insurance.  Pt is walking on the leg with assistance of a cane.  He is bearing weight on the leg.   Pt says he thinks he had cholesterol check several years ago.  He doesn't know if it was high or not.  Pt was not forthcoming about drug use.  He initially stated no history of drug use but then only admitted to use due to it was in the EHR.   Relevant past medical, surgical, family and social history reviewed and updated as indicated. Interim medical history since our last visit reviewed. Allergies and medications reviewed and updated.   Current Outpatient Medications:  .  oxyCODONE-acetaminophen (PERCOCET/ROXICET) 5-325 MG tablet, Take 1 tablet by mouth. Every 4-6 hours as needed, Disp: , Rfl:    Review of Systems  Constitutional: Negative for appetite change, chills, diaphoresis, fatigue, fever and unexpected weight change.  HENT: Negative for congestion, dental problem, drooling, ear pain, facial swelling, hearing loss, mouth sores, sneezing, sore throat, trouble swallowing and voice change.   Eyes: Negative for pain, discharge, redness, itching and visual disturbance.  Respiratory: Negative for cough, choking, shortness of  breath and wheezing.   Cardiovascular: Positive for leg swelling. Negative for chest pain and palpitations.  Gastrointestinal: Negative for abdominal pain, blood in stool, constipation, diarrhea and vomiting.  Endocrine: Negative for cold intolerance, heat intolerance and polydipsia.  Genitourinary: Negative for decreased urine volume, dysuria and hematuria.  Musculoskeletal: Positive for arthralgias and gait problem. Negative for back pain.  Skin: Positive for rash.  Allergic/Immunologic: Negative for environmental allergies.  Neurological: Negative for seizures, syncope, light-headedness and headaches.  Hematological: Negative for adenopathy.  Psychiatric/Behavioral: Negative for agitation, dysphoric mood and suicidal ideas. The patient is not nervous/anxious.     Per HPI unless specifically indicated above     Objective:    BP (!) 146/91 (BP Location: Left Arm, Patient Position: Sitting, Cuff Size: Normal)   Pulse 84   Temp 97.8 F (36.6 C)   Ht 5\' 8"  (1.727 m)   Wt 212 lb (96.2 kg)   SpO2 98%   BMI 32.23 kg/m   Wt Readings from Last 3 Encounters:  02/08/18 212 lb (96.2 kg)  08/15/17 278 lb 2 oz (126.2 kg)  09/03/15 203 lb (92.1 kg)    Physical Exam  Constitutional: He is oriented to person, place, and time. He appears well-developed and well-nourished.  HENT:  Head: Normocephalic and atraumatic.  Mouth/Throat: Oropharynx is clear and moist. No oropharyngeal exudate.  Eyes: Pupils are equal, round, and reactive to light. Conjunctivae and  EOM are normal.  Neck: Neck supple. No thyromegaly present.  Cardiovascular: Normal rate and regular rhythm.  Pulmonary/Chest: Effort normal and breath sounds normal. He has no wheezes. He has no rales.  Abdominal: Soft. Bowel sounds are normal. He exhibits no mass. There is no hepatosplenomegaly. There is no tenderness.  Musculoskeletal: He exhibits no edema.       Right foot: There is decreased range of motion, tenderness, bony  tenderness and swelling.  Mild swelling R lateral malleolus with tenderness.  The extremity is very dirty and has several open wounds rubbed on it from the nasty condition of the splint.  The splint has the cotton lining wadded up in bunches with dirt and staining.  The ace wrap that is supposed to go around the outside of the splint is wound in and out and goes underneath the cotton in some places.  The extremity is foul smelling.  The open wounds do not appear red or purulent.   Lymphadenopathy:    He has no cervical adenopathy.  Neurological: He is alert and oriented to person, place, and time.  Skin: Skin is warm and dry. No rash noted.  Psychiatric: He has a normal mood and affect. His behavior is normal. Thought content normal.  Vitals reviewed.       Assessment & Plan:    Encounter Diagnoses  Name Primary?  . Encounter to establish care Yes  . 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 Gastroesophageal reflux disease, esophagitis presence not specified   . Screening cholesterol level   . Screening for prostate cancer   . Elevated blood pressure reading   . Cigarette nicotine dependence without complication   . Closed fracture of distal end of right fibula with routine healing, unspecified fracture morphology, subsequent encounter   . Open wound of right lower leg, initial encounter   . History of substance abuse     -Right lower leg cleaned and Rewrapped with his plaster splint.  Recommended pt use crutches/no weight bearing until seen by orthopedist this Friday.  Encouraged pt to keep that appointment because he needs a new splint.  Discussed with pt that we do not have the supplies here to handle his fracture.  Discussed that failure to care for it properly now could lead to poor healing and require more care in the future.  -Check fasting lipids, psa -will give pt ifobt at next OV for colon cancer screening -will Refer to orthopedics but discussed with pt that he  will need the [facility] to be seen without charge and that takes about a month.  recommended pt go to his scheduled appointment this Friday -pt is given application for [facility] -pt is given rx for his acyclovir for recurrent herpes and rx for omeprazole for his GERD -Follow uip 1 month to recheck blood pressure.

## 2018-02-12 ENCOUNTER — Encounter: Payer: Self-pay | Admitting: Physician Assistant

## 2018-03-13 ENCOUNTER — Ambulatory Visit: Payer: Self-pay | Admitting: Physician Assistant

## 2018-03-29 ENCOUNTER — Ambulatory Visit: Payer: Self-pay | Admitting: Physician Assistant

## 2018-09-01 ENCOUNTER — Other Ambulatory Visit: Payer: Self-pay

## 2018-09-01 ENCOUNTER — Emergency Department (HOSPITAL_COMMUNITY)
Admission: EM | Admit: 2018-09-01 | Discharge: 2018-09-01 | Disposition: A | Discharge status: Home / Self Care | Payer: Self-pay | Attending: Emergency Medicine | Admitting: Emergency Medicine

## 2018-09-01 ENCOUNTER — Emergency Department (HOSPITAL_COMMUNITY): Payer: Self-pay

## 2018-09-01 ENCOUNTER — Encounter (HOSPITAL_COMMUNITY): Payer: Self-pay | Admitting: Emergency Medicine

## 2018-09-01 DIAGNOSIS — M79672 Pain in left foot: Secondary | ICD-10-CM | POA: Insufficient documentation

## 2018-09-01 DIAGNOSIS — A6000 Herpesviral infection of urogenital system, unspecified: Secondary | ICD-10-CM

## 2018-09-01 DIAGNOSIS — M79671 Pain in right foot: Secondary | ICD-10-CM | POA: Insufficient documentation

## 2018-09-01 DIAGNOSIS — B009 Herpesviral infection, unspecified: Secondary | ICD-10-CM | POA: Insufficient documentation

## 2018-09-01 DIAGNOSIS — F17210 Nicotine dependence, cigarettes, uncomplicated: Secondary | ICD-10-CM | POA: Insufficient documentation

## 2018-09-01 DIAGNOSIS — K219 Gastro-esophageal reflux disease without esophagitis: Secondary | ICD-10-CM

## 2018-09-01 MED ORDER — ACYCLOVIR 400 MG PO TABS: 400.0000 mg | ORAL_TABLET | Freq: Three times a day (TID) | ORAL | 1 refills | Status: DC

## 2018-09-01 MED ORDER — KETOROLAC TROMETHAMINE 30 MG/ML IJ SOLN
30.0000 mg | Freq: Once | INTRAMUSCULAR | Status: AC
  Administered 2018-09-01: 30 mg via INTRAMUSCULAR
  Filled 2018-09-01: qty 1

## 2018-09-01 MED ORDER — OMEPRAZOLE 20 MG PO CPDR: 20.0000 mg | DELAYED_RELEASE_CAPSULE | Freq: Every day | ORAL | 1 refills | Status: DC

## 2018-09-01 MED ORDER — IBUPROFEN 600 MG PO TABS: 600.0000 mg | ORAL_TABLET | Freq: Four times a day (QID) | ORAL | 0 refills | Status: DC | PRN

## 2018-09-01 MED ORDER — CETIRIZINE HCL 10 MG PO TABS: 10.0000 mg | ORAL_TABLET | Freq: Every day | ORAL | 0 refills | Status: DC

## 2018-09-01 NOTE — ED Triage Notes (Signed)
Bilateral foot pain states left was stomped by horse many years ago and rt was hurt in June when he stepped in hole and broke it then denies new injury but states is sore when he walks on it

## 2018-09-01 NOTE — ED Notes (Signed)
Patient verbalizes understanding of discharge instructions. Opportunity for questioning and answers were provided. Armband removed by staff, pt discharged from ED.  
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
## 2018-09-16 ENCOUNTER — Encounter (HOSPITAL_COMMUNITY): Payer: Self-pay

## 2018-09-16 ENCOUNTER — Emergency Department (HOSPITAL_COMMUNITY)
Admission: EM | Admit: 2018-09-16 | Discharge: 2018-09-16 | Disposition: A | Discharge status: Home / Self Care | Payer: Self-pay | Attending: Emergency Medicine | Admitting: Emergency Medicine

## 2018-09-16 ENCOUNTER — Other Ambulatory Visit: Payer: Self-pay

## 2018-09-16 DIAGNOSIS — B9689 Other specified bacterial agents as the cause of diseases classified elsewhere: Secondary | ICD-10-CM

## 2018-09-16 DIAGNOSIS — F17210 Nicotine dependence, cigarettes, uncomplicated: Secondary | ICD-10-CM | POA: Insufficient documentation

## 2018-09-16 DIAGNOSIS — Z79899 Other long term (current) drug therapy: Secondary | ICD-10-CM | POA: Insufficient documentation

## 2018-09-16 DIAGNOSIS — J019 Acute sinusitis, unspecified: Secondary | ICD-10-CM | POA: Insufficient documentation

## 2018-09-16 LAB — GROUP A STREP BY PCR: Group A Strep by PCR: NOT DETECTED

## 2018-09-16 MED ORDER — KETOROLAC TROMETHAMINE 60 MG/2ML IM SOLN
30.0000 mg | Freq: Once | INTRAMUSCULAR | Status: AC
  Administered 2018-09-16: 30 mg via INTRAMUSCULAR
  Filled 2018-09-16: qty 2

## 2018-09-16 MED ORDER — AMOXICILLIN-POT CLAVULANATE 875-125 MG PO TABS: 1.0000 | ORAL_TABLET | Freq: Two times a day (BID) | ORAL | 0 refills | Status: AC

## 2018-09-16 MED ORDER — DEXAMETHASONE SODIUM PHOSPHATE 10 MG/ML IJ SOLN: 10.0000 mg | Freq: Once | INTRAMUSCULAR | Status: DC

## 2018-09-16 MED ORDER — AMOXICILLIN-POT CLAVULANATE 875-125 MG PO TABS
1.0000 | ORAL_TABLET | Freq: Once | ORAL | Status: AC
  Administered 2018-09-16: 1 via ORAL
  Filled 2018-09-16: qty 1

## 2018-09-16 MED ORDER — DEXAMETHASONE SODIUM PHOSPHATE 10 MG/ML IJ SOLN
10.0000 mg | Freq: Once | INTRAMUSCULAR | Status: AC
  Administered 2018-09-16: 10 mg via INTRAMUSCULAR
  Filled 2018-09-16: qty 1

## 2018-09-16 NOTE — ED Triage Notes (Signed)
Pt reports sore throat, headache, stiff neck and a hoarse voice x 11 days. Afebrile with EMS. States that he has been taking OTC cold medications without relief.

## 2018-09-16 NOTE — ED Notes (Signed)
Bed: WTR6 Expected date:  Expected time:  Means of arrival:  Comments: 
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
## 2018-09-16 NOTE — ED Notes (Signed)
Called no answer

## 2018-09-16 NOTE — Discharge Instructions (Signed)
Thank you for allowing me to care for you today in the Emergency Department.   You can take 600 mg of ibuprofen with food or 650 mg of Tylenol once every 6 hours for pain control.  You can also alternate between these 2 medications every 3 hours.  Your symptoms are consistent with a sinus infection.  Take 1 tablet of Augmentin 2 times daily for the next 10 days.  Your first dose was given in the ER.  You can try performing sinus rinses to see if this will improve your symptoms.  Return to the emergency department if you develop changes in your vision, loss of vision in one or both eyes, thick, mucus-like drainage from your eyes, confusion, if you become unable to move your neck, or other new, concerning symptoms.

## 2018-09-16 NOTE — ED Notes (Signed)
Patient sleeping in waiting room-awakened now to room for evaluation

## 2018-09-18 ENCOUNTER — Encounter: Payer: Self-pay | Admitting: Pediatric Intensive Care

## 2018-09-18 MED FILL — AMOX-CLAV 875-125 MG TABLET: 875-125 | 10 days supply | Qty: 20 | Fill #0

## 2018-10-19 NOTE — Congregational Nurse Program (Signed)
  Dept: [phone]   Congregational Nurse Program Note  Date of Encounter: 09/18/2018  Past Medical History: Past Medical History:  Diagnosis Date  . Anxiety   . Depression   . GERD (gastroesophageal reflux disease)   . Herpes     Encounter Details:Client has prescription form ED visit which he needs filled. CN will meet client to drop off medication at 1300. Shann Medal RN BSN CNP [phone]

## 2019-07-31 ENCOUNTER — Other Ambulatory Visit: Payer: Self-pay

## 2019-07-31 ENCOUNTER — Ambulatory Visit: Payer: Medicaid Other | Attending: Internal Medicine

## 2019-07-31 DIAGNOSIS — Z20822 Contact with and (suspected) exposure to covid-19: Secondary | ICD-10-CM

## 2019-08-01 ENCOUNTER — Other Ambulatory Visit: Payer: Self-pay

## 2019-08-01 LAB — NOVEL CORONAVIRUS, NAA: SARS-CoV-2, NAA: NOT DETECTED

## 2021-04-18 ENCOUNTER — Emergency Department (HOSPITAL_COMMUNITY): Payer: Medicaid Other

## 2021-04-18 ENCOUNTER — Other Ambulatory Visit: Payer: Self-pay

## 2021-04-18 ENCOUNTER — Emergency Department (HOSPITAL_COMMUNITY)
Admission: EM | Admit: 2021-04-18 | Discharge: 2021-04-18 | Discharge status: Against Medical Advice | Payer: Medicaid Other | Attending: Emergency Medicine | Admitting: Emergency Medicine

## 2021-04-18 ENCOUNTER — Encounter (HOSPITAL_COMMUNITY): Payer: Self-pay

## 2021-04-18 DIAGNOSIS — R531 Weakness: Secondary | ICD-10-CM

## 2021-04-18 DIAGNOSIS — R791 Abnormal coagulation profile: Secondary | ICD-10-CM | POA: Diagnosis not present

## 2021-04-18 DIAGNOSIS — F17210 Nicotine dependence, cigarettes, uncomplicated: Secondary | ICD-10-CM | POA: Insufficient documentation

## 2021-04-18 DIAGNOSIS — Y900 Blood alcohol level of less than 20 mg/100 ml: Secondary | ICD-10-CM | POA: Diagnosis not present

## 2021-04-18 DIAGNOSIS — Z7982 Long term (current) use of aspirin: Secondary | ICD-10-CM | POA: Insufficient documentation

## 2021-04-18 DIAGNOSIS — R200 Anesthesia of skin: Secondary | ICD-10-CM | POA: Insufficient documentation

## 2021-04-18 LAB — DIFFERENTIAL
Abs Immature Granulocytes: 0.01 10*3/uL (ref 0.00–0.07)
Basophils Absolute: 0.1 10*3/uL (ref 0.0–0.1)
Basophils Relative: 1 %
Eosinophils Absolute: 0.1 10*3/uL (ref 0.0–0.5)
Eosinophils Relative: 2 %
Immature Granulocytes: 0 %
Lymphocytes Relative: 31 %
Lymphs Abs: 2.4 10*3/uL (ref 0.7–4.0)
Monocytes Absolute: 0.5 10*3/uL (ref 0.1–1.0)
Monocytes Relative: 7 %
Neutro Abs: 4.5 10*3/uL (ref 1.7–7.7)
Neutrophils Relative %: 59 %

## 2021-04-18 LAB — I-STAT CHEM 8, ED
BUN: 19 mg/dL (ref 6–20)
Calcium, Ion: 1.17 mmol/L (ref 1.15–1.40)
Chloride: 99 mmol/L (ref 98–111)
Creatinine, Ser: 0.8 mg/dL (ref 0.61–1.24)
Glucose, Bld: 99 mg/dL (ref 70–99)
HCT: 44 % (ref 39.0–52.0)
Hemoglobin: 15 g/dL (ref 13.0–17.0)
Potassium: 4.2 mmol/L (ref 3.5–5.1)
Sodium: 138 mmol/L (ref 135–145)
TCO2: 32 mmol/L (ref 22–32)

## 2021-04-18 LAB — PROTIME-INR
INR: 1 (ref 0.8–1.2)
Prothrombin Time: 13 seconds (ref 11.4–15.2)

## 2021-04-18 LAB — ETHANOL: Alcohol, Ethyl (B): <10 mg/dL (ref <10)

## 2021-04-18 LAB — COMPREHENSIVE METABOLIC PANEL
ALT: 23 U/L (ref 0–44)
AST: 26 U/L (ref 15–41)
Albumin: 4 g/dL (ref 3.5–5.0)
Alkaline Phosphatase: 105 U/L (ref 38–126)
Anion gap: 7 (ref 5–15)
BUN: 19 mg/dL (ref 6–20)
CO2: 29 mmol/L (ref 22–32)
Calcium: 9.1 mg/dL (ref 8.9–10.3)
Chloride: 98 mmol/L (ref 98–111)
Creatinine, Ser: 0.73 mg/dL (ref 0.61–1.24)
GFR, Estimated: >60 mL/min (ref >60)
Glucose, Bld: 98 mg/dL (ref 70–99)
Potassium: 3.8 mmol/L (ref 3.5–5.1)
Sodium: 134 mmol/L — ABNORMAL LOW (ref 135–145)
Total Bilirubin: 0.2 mg/dL — ABNORMAL LOW (ref 0.3–1.2)
Total Protein: 8.1 g/dL (ref 6.5–8.1)

## 2021-04-18 LAB — CBC
HCT: 45.1 % (ref 39.0–52.0)
Hemoglobin: 15.2 g/dL (ref 13.0–17.0)
MCH: 31.5 pg (ref 26.0–34.0)
MCHC: 33.7 g/dL (ref 30.0–36.0)
MCV: 93.6 fL (ref 80.0–100.0)
Platelets: 324 10*3/uL (ref 150–400)
RBC: 4.82 MIL/uL (ref 4.22–5.81)
RDW: 14 % (ref 11.5–15.5)
WBC: 7.6 10*3/uL (ref 4.0–10.5)
nRBC: 0 % (ref 0.0–0.2)

## 2021-04-18 LAB — CBG MONITORING, ED: Glucose-Capillary: 104 mg/dL — ABNORMAL HIGH (ref 70–99)

## 2021-04-18 LAB — APTT: aPTT: 29 seconds (ref 24–36)

## 2021-04-18 MED ORDER — SODIUM CHLORIDE 0.9 % IV BOLUS
500.0000 mL | Freq: Once | INTRAVENOUS | Status: AC
  Administered 2021-04-18: 500 mL via INTRAVENOUS

## 2021-04-18 MED ORDER — ASPIRIN EC 325 MG PO TBEC
325.0000 mg | DELAYED_RELEASE_TABLET | Freq: Every day | ORAL | 0 refills | Status: DC
Start: 2021-04-18 — End: 2022-01-08

## 2021-04-18 MED ORDER — ASPIRIN 325 MG PO TABS: 325.0000 mg | ORAL_TABLET | Freq: Once | ORAL | Status: DC

## 2021-04-18 MED ORDER — SODIUM CHLORIDE 0.9 % IV SOLN: 100.0000 mL/h | INTRAVENOUS | Status: DC

## 2021-04-18 NOTE — ED Triage Notes (Signed)
Pt reports has been having TIAs for the past 11 months.  Reports went to bed at 1030pm and woke up at 4 am with left arm numbness.  Reports left arm is still numb.  Denies any other symptoms.  Face symmetrical.  Pt alert and oriented.  Denies headache.

## 2021-04-18 NOTE — Progress Notes (Signed)
1635 CALL TIME 1636 BEEPER TIME 1639 EXAM STARTED 1641 EXAM FINISHED 1642 IMAGES SENT TO SOC 1644 EXAM COMPLETED IN Epic 1644 [facility] CALLED

## 2021-04-18 NOTE — Discharge Instructions (Addendum)
You may be having a stroke.  We highly recommend you get a MRI.  You can go to [facility] at any time.    Start taking an aspirin a day.
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
## 2021-04-18 NOTE — ED Notes (Signed)
Pt encouraged to give urine sample

## 2021-04-18 NOTE — ED Notes (Signed)
EDP reports to RN that pt is wanting to leave AMA.

## 2021-04-18 NOTE — Consult Note (Signed)
[facility] Telemedicine Consult   Requesting Provider: Para Skeans Consult Participants: Patient, Bedside nurse( Location of the provider: [place], [place] Location of the patient: [facility]  This consult was provided via telemedicine with 2-way video and audio communication. The patient/family was informed that care would be provided in this way and agreed to receive care in this manner.    Chief Complaint: Left Hand "numbness"  HPI: Mr. Carlos Johnson is a 60 year old male who reportedly had a stroke about a month ago causing right hand weakness and numbness.  Since that time, he has had gradual improvement of his right hand, but persistent difficulty with finger agility and extension of the wrist.  He presents today with numbness of the left hand and weakness in a similar distribution to what her stroke calls last month.  In care everywhere, I see a CT from June 2022 showing a 1 cm area of hypodensity in the left frontal subcortical white matter.  Reportedly he had an MRI done about a month ago which was positive for an acute ischemic stroke corresponding to his right hand numbness.  Today, he was in his normal state of health last evening when he went to bed   LKW: 9/9 prior to bed tpa given?: No, out of window IR Thrombectomy? No, no LVO signs Modified Rankin Scale: 2-Slight disability-UNABLE to perform all activities but does not need assistance Time of teleneurologist evaluation: 17:05  Exam: Vitals:   04/18/21 1631  BP: (!) 154/99  Pulse: 92  Resp: 18  Temp: 98.1 F (36.7 C)  SpO2: 100%    General: in bed, NAD  1A: Level of Consciousness - 0 1B: Ask Month and Age - 0 1C: 'Blink Eyes' & 'Squeeze Hands' - 0 2: Test Horizontal Extraocular Movements - 0 3: Test Visual Fields - 0 4: Test Facial Palsy - 0 5A: Test Left Arm Motor Drift - 0 5B: Test Right Arm Motor Drift - 0 6A: Test Left Leg Motor Drift - 0 6B: Test Right Leg Motor Drift - 0 7: Test Limb  Ataxia - 0 8: Test Sensation - 1 9: Test Language/Aphasia- 0 10: Test Dysarthria - 0 11: Test Extinction/Inattention - 0 NIHSS score: 1  He has difficulty with extension of the wrists bilaterally, also with trouble with finger abduction. He has mild numbness of the left hand as well, not in a dermatomal distribution.   Imaging Reviewed: CT report reviewed, no evidence of acute findings  Labs reviewed in epic and pertinent values follow: Chem-8-normal   Assessment: 60 year old male with recent previous stroke affecting right hand agility who now presents with relatively symmetric left hand dysfunction.  Without images to review of his previous stroke, it is unclear to me why it would be quite so symmetric, and therefore I do think cervical spine disease is raised as well, though he has no symptoms suggesting it.  I would favor further work-up with MRI of his brain and cervical spine, as well as MR angiography of his head and neck.  Recommendations:  1) MRI brain, MRI cervical spine, MRA head and neck 2) if positive for stroke, echo, lipid panel, A1c 3) antiplatelet therapy, if he does have an acute stroke he would merit dual antiplatelet therapy with aspirin and Plavix for 3 weeks. 4) further recommendations pending above imaging.    This patient is receiving care for possible acute neurological changes. There was 35 minutes of care by this provider at the time of service, including time for direct  evaluation via telemedicine, review of medical records, imaging studies and discussion of findings with providers, the patient and/or family.  Ritta Slot, MD [facility] [phone]  If 7pm- 7am, please page neurology on call as listed in [facility].

## 2021-04-19 ENCOUNTER — Other Ambulatory Visit: Payer: Self-pay

## 2021-04-19 ENCOUNTER — Emergency Department (HOSPITAL_COMMUNITY)
Admission: EM | Admit: 2021-04-19 | Discharge: 2021-04-20 | Disposition: A | Discharge status: Home / Self Care | Payer: Medicaid Other | Attending: Emergency Medicine | Admitting: Emergency Medicine

## 2021-04-19 ENCOUNTER — Emergency Department (HOSPITAL_COMMUNITY): Payer: Medicaid Other

## 2021-04-19 ENCOUNTER — Encounter (HOSPITAL_COMMUNITY): Payer: Self-pay | Admitting: Emergency Medicine

## 2021-04-19 DIAGNOSIS — Z5321 Procedure and treatment not carried out due to patient leaving prior to being seen by health care provider: Secondary | ICD-10-CM | POA: Diagnosis not present

## 2021-04-19 DIAGNOSIS — R29818 Other symptoms and signs involving the nervous system: Secondary | ICD-10-CM | POA: Insufficient documentation

## 2021-04-19 LAB — COMPREHENSIVE METABOLIC PANEL
ALT: 20 U/L (ref 0–44)
AST: 29 U/L (ref 15–41)
Albumin: 3.6 g/dL (ref 3.5–5.0)
Alkaline Phosphatase: 95 U/L (ref 38–126)
Anion gap: 8 (ref 5–15)
BUN: 18 mg/dL (ref 6–20)
CO2: 28 mmol/L (ref 22–32)
Calcium: 9.1 mg/dL (ref 8.9–10.3)
Chloride: 100 mmol/L (ref 98–111)
Creatinine, Ser: 0.74 mg/dL (ref 0.61–1.24)
GFR, Estimated: >60 mL/min (ref >60)
Glucose, Bld: 123 mg/dL — ABNORMAL HIGH (ref 70–99)
Potassium: 3.8 mmol/L (ref 3.5–5.1)
Sodium: 136 mmol/L (ref 135–145)
Total Bilirubin: 0.5 mg/dL (ref 0.3–1.2)
Total Protein: 7.2 g/dL (ref 6.5–8.1)

## 2021-04-19 LAB — CBC WITH DIFFERENTIAL/PLATELET
Abs Immature Granulocytes: 0.01 10*3/uL (ref 0.00–0.07)
Basophils Absolute: 0 10*3/uL (ref 0.0–0.1)
Basophils Relative: 0 %
Eosinophils Absolute: 0.2 10*3/uL (ref 0.0–0.5)
Eosinophils Relative: 3 %
HCT: 43.6 % (ref 39.0–52.0)
Hemoglobin: 14.4 g/dL (ref 13.0–17.0)
Immature Granulocytes: 0 %
Lymphocytes Relative: 33 %
Lymphs Abs: 2.5 10*3/uL (ref 0.7–4.0)
MCH: 31 pg (ref 26.0–34.0)
MCHC: 33 g/dL (ref 30.0–36.0)
MCV: 93.8 fL (ref 80.0–100.0)
Monocytes Absolute: 0.7 10*3/uL (ref 0.1–1.0)
Monocytes Relative: 9 %
Neutro Abs: 4.2 10*3/uL (ref 1.7–7.7)
Neutrophils Relative %: 55 %
Platelets: 330 10*3/uL (ref 150–400)
RBC: 4.65 MIL/uL (ref 4.22–5.81)
RDW: 14.2 % (ref 11.5–15.5)
WBC: 7.7 10*3/uL (ref 4.0–10.5)
nRBC: 0 % (ref 0.0–0.2)

## 2021-04-19 NOTE — ED Notes (Signed)
Pt stated that he has a 2 hr drive home and is leaving. Pt was advised to stay but still decided to leave.

## 2021-04-19 NOTE — ED Notes (Signed)
Called but no response x1

## 2021-04-19 NOTE — ED Triage Notes (Signed)
Pt here requesting to have an MRI, was seen at [facility] yesterday for new onset left hand weakness. Hx stroke with right hand weakness and numbness. Denies other complaints. A&Ox4.

## 2021-08-26 ENCOUNTER — Ambulatory Visit (HOSPITAL_COMMUNITY)
Admission: EM | Admit: 2021-08-26 | Discharge: 2021-08-26 | Disposition: A | Discharge status: Home / Self Care | Payer: Medicaid Other | Source: Home / Self Care

## 2021-08-26 ENCOUNTER — Other Ambulatory Visit: Payer: Self-pay

## 2021-08-26 ENCOUNTER — Encounter (HOSPITAL_COMMUNITY): Payer: Self-pay

## 2021-08-26 ENCOUNTER — Emergency Department (HOSPITAL_COMMUNITY)
Admission: EM | Admit: 2021-08-26 | Discharge: 2021-08-26 | Disposition: A | Discharge status: Home / Self Care | Payer: Medicaid Other | Source: Home / Self Care | Attending: Emergency Medicine | Admitting: Emergency Medicine

## 2021-08-26 ENCOUNTER — Encounter (HOSPITAL_COMMUNITY): Payer: Self-pay | Admitting: *Deleted

## 2021-08-26 ENCOUNTER — Emergency Department (HOSPITAL_COMMUNITY)
Admission: EM | Admit: 2021-08-26 | Discharge: 2021-08-27 | Disposition: A | Discharge status: Home / Self Care | Payer: Medicaid Other | Attending: Emergency Medicine | Admitting: Emergency Medicine

## 2021-08-26 DIAGNOSIS — Z7982 Long term (current) use of aspirin: Secondary | ICD-10-CM | POA: Insufficient documentation

## 2021-08-26 DIAGNOSIS — M5136 Other intervertebral disc degeneration, lumbar region: Secondary | ICD-10-CM | POA: Insufficient documentation

## 2021-08-26 DIAGNOSIS — Z79899 Other long term (current) drug therapy: Secondary | ICD-10-CM | POA: Insufficient documentation

## 2021-08-26 DIAGNOSIS — Z560 Unemployment, unspecified: Secondary | ICD-10-CM | POA: Insufficient documentation

## 2021-08-26 DIAGNOSIS — K2100 Gastro-esophageal reflux disease with esophagitis, without bleeding: Secondary | ICD-10-CM | POA: Insufficient documentation

## 2021-08-26 DIAGNOSIS — F1190 Opioid use, unspecified, uncomplicated: Secondary | ICD-10-CM | POA: Insufficient documentation

## 2021-08-26 DIAGNOSIS — F1010 Alcohol abuse, uncomplicated: Secondary | ICD-10-CM | POA: Insufficient documentation

## 2021-08-26 DIAGNOSIS — F4323 Adjustment disorder with mixed anxiety and depressed mood: Secondary | ICD-10-CM | POA: Insufficient documentation

## 2021-08-26 DIAGNOSIS — Y900 Blood alcohol level of less than 20 mg/100 ml: Secondary | ICD-10-CM | POA: Diagnosis not present

## 2021-08-26 DIAGNOSIS — R4182 Altered mental status, unspecified: Secondary | ICD-10-CM | POA: Diagnosis not present

## 2021-08-26 DIAGNOSIS — F1910 Other psychoactive substance abuse, uncomplicated: Secondary | ICD-10-CM | POA: Diagnosis present

## 2021-08-26 DIAGNOSIS — G8929 Other chronic pain: Secondary | ICD-10-CM

## 2021-08-26 DIAGNOSIS — I4891 Unspecified atrial fibrillation: Secondary | ICD-10-CM | POA: Insufficient documentation

## 2021-08-26 DIAGNOSIS — Z79891 Long term (current) use of opiate analgesic: Secondary | ICD-10-CM

## 2021-08-26 DIAGNOSIS — F17210 Nicotine dependence, cigarettes, uncomplicated: Secondary | ICD-10-CM | POA: Insufficient documentation

## 2021-08-26 DIAGNOSIS — F319 Bipolar disorder, unspecified: Secondary | ICD-10-CM

## 2021-08-26 DIAGNOSIS — F1510 Other stimulant abuse, uncomplicated: Secondary | ICD-10-CM | POA: Insufficient documentation

## 2021-08-26 DIAGNOSIS — A6000 Herpesviral infection of urogenital system, unspecified: Secondary | ICD-10-CM | POA: Insufficient documentation

## 2021-08-26 DIAGNOSIS — Z8673 Personal history of transient ischemic attack (TIA), and cerebral infarction without residual deficits: Secondary | ICD-10-CM | POA: Insufficient documentation

## 2021-08-26 DIAGNOSIS — F1410 Cocaine abuse, uncomplicated: Secondary | ICD-10-CM | POA: Insufficient documentation

## 2021-08-26 DIAGNOSIS — Z760 Encounter for issue of repeat prescription: Secondary | ICD-10-CM | POA: Insufficient documentation

## 2021-08-26 NOTE — ED Triage Notes (Signed)
Pt comes from [facility] via safe transport, he doesn't meet any criteria for them He complains of back pain and states that he's out of all his meds, he wanted [facility] to fill all his meds including oxycodone, adderall, valtrex and gabapentin He told RN at [facility] that he needed help fixing his car and needed his adderall Pt arrives here by [facility] and suddenly becomes 'unresponsive" in triage, pt was brought back to a room and responded to ammonia and was able to answer questions

## 2021-08-26 NOTE — ED Provider Notes (Signed)
[facility] EMERGENCY DEPARTMENT Provider Note   CSN: [id number] Arrival date & time: 08/26/21  1708     History   No chief complaint on file.   Carlos Johnson is a 61 y.o. male with a past medical history of polysubstance misuse, chronic opiate therapy and opiate dependence with chronic back pain.  He presents because he ran out of his pain medication.  He states that he needs his pain medications and that his doctor told us that we need to give them to him.  He also said that he needs all of his blood pressure pressure medication refilled.  He is tells me that he has had "multiple strokes to my back."  He wants his entire back imaged because he wants to prove to his doctor that he has had strokes to his back.  He has no new injuries to his back.  He has no new complaints about his back.  HPI     Home Medications Prior to Admission medications   Medication Sig Start Date End Date Taking? Authorizing Provider  acyclovir (ZOVIRAX) 400 MG tablet Take 1 tablet (400 mg total) by mouth 3 (three) times daily. 09/01/18   Jacalyn Lefevre, MD  aspirin EC 325 MG tablet Take 1 tablet (325 mg total) by mouth daily. 04/18/21   Jacalyn Lefevre, MD  cetirizine (ZYRTEC ALLERGY) 10 MG tablet Take 1 tablet (10 mg total) by mouth daily. 09/01/18   Jacalyn Lefevre, MD  ibuprofen (ADVIL,MOTRIN) 600 MG tablet Take 1 tablet (600 mg total) by mouth every 6 (six) hours as needed. 09/01/18   Jacalyn Lefevre, MD  omeprazole (PRILOSEC) 20 MG capsule Take 1 capsule (20 mg total) by mouth daily. 09/01/18   Jacalyn Lefevre, MD  oxyCODONE-acetaminophen (PERCOCET/ROXICET) 5-325 MG tablet Take 1 tablet by mouth. Every 4-6 hours as needed    [provider]      Allergies    Patient has no known allergies.    Review of Systems   Review of Systems  Physical Exam Updated Vital Signs BP (!) 154/106 (BP Location: Right Arm)    Pulse (!) 109    Temp 97.8 F (36.6 C) (Oral)    Resp 18    Ht  5\' 7"  (1.702 m)    Wt 71.3 kg    SpO2 100%    BMI 24.62 kg/m  Physical Exam Vitals and nursing note reviewed.  Constitutional:      General: He is not in acute distress.    Appearance: He is well-developed. He is not diaphoretic.  HENT:     Head: Normocephalic and atraumatic.  Eyes:     General: No scleral icterus.    Extraocular Movements: Extraocular movements intact.     Conjunctiva/sclera: Conjunctivae normal.     Pupils: Pupils are equal, round, and reactive to light.  Cardiovascular:     Rate and Rhythm: Regular rhythm. Tachycardia present.     Heart sounds: Normal heart sounds.     Comments: Mild tachycardia Pulmonary:     Effort: Pulmonary effort is normal. No respiratory distress.     Breath sounds: Normal breath sounds.  Abdominal:     Palpations: Abdomen is soft.     Tenderness: There is no abdominal tenderness.  Musculoskeletal:     Cervical back: Normal range of motion and neck supple.  Skin:    General: Skin is warm and dry.  Neurological:     Mental Status: He is alert.  Psychiatric:  Behavior: Behavior normal.    ED Results / Procedures / Treatments   Labs (all labs ordered are listed, but only abnormal results are displayed) Labs Reviewed - No data to display  EKG None  Radiology No results found.  Procedures Procedures   Medications Ordered in ED Medications - No data to display  ED Course/ Medical Decision Making/ A&P                           Medical Decision Making  Patient states that he does not want to be detoxed from medication.  I have discussed that he may go to behavioral health urgent care if he wishes to be detox. Patient has no new spinal injuries and a normal evaluation without weakness of the lower or upper extremities. I discussed that we do not do any chronic pain management and we do not prescribe medications especially when you are under contract.  Patient is seen at [facility].  He is asking for his home  medication refills.  I have advised that since he is following up without his pain medication tomorrow he may call his PCP and get his regular medications refilled as well.  Do not have his current medications listed in the patient does not know what he takes.  He has mild tachycardia and hypertension.  May be due to some discomfort from withdrawal symptoms.  He does have a history of alcohol misuse disorder.  He does not appear intoxicated at this time.  He is not agitated or combative.  He is of sound mind and can make his own decisions.  Patient is advised to follow closely with his primary care physician tomorrow for management of all of his chronic conditions. Final Clinical Impression(s) / ED Diagnoses Final diagnoses:  None    Rx / DC Orders ED Discharge Orders     None         Arthor Captain, PA-C 08/26/21 1844    Pollyann Savoy, MD 08/26/21 2226

## 2021-08-26 NOTE — ED Provider Notes (Addendum)
Behavioral Health Urgent Care Medical Screening Exam  Patient Name: Carlos Johnson MRN: [id number] Date of Evaluation: 08/26/21 Chief Complaint:  Medication Refills  Diagnosis:  Final diagnoses:  Bipolar affective disorder, remission status unspecified ([place])    History of Present illness: Carlos Johnson is a 61 y.o. male with documented past psychiatric history significant for bipolar disorder/bipolar affective disorder, anxiety, depression, adjustment disorder with mixed anxiety and depressed mood, methamphetamine abuse, cocaine abuse, and history of alcohol use disorder, as well as documented past medical history significant for abnormality of lumbar and chest x-ray, close compression fracture of body of L1 vertebra, GERD with esophagitis, lumbar degenerative disc disease, A. 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  Per chart review, patient presented to [facility] emergency department earlier today on 08/26/2021 requesting refills on his pain medications and blood pressure medications.  Patient was ultimately medically cleared in the ED at that time and was discharged with recommendation to follow up with his PCP at [facility] tomorrow on 08/27/2028 for refills of his medications noted above, as well as with the recommendation to present to the [facility] if he would like to seek treatment for detox.  Patient states that he came to the [facility] because he needs refills on oxycodone, Adderall, "blood pressure pills", Valtrex, and gabapentin and also because he needs a place to  sleep.  He reports he has been out of all these medications for at least a few weeks.  Patient reports that he still has 7-8 oxycodone tablets at his home in [place], but he states that he has not been in his home for the past 1 to 2 weeks.  Patient unable to provide details regarding the dosage/frequency of his oxycodone prescription.  Patient reports that his Adderall prescription is 5 to 10 mg 4 times per day.  Patient unsure of gabapentin dosage and states that he takes this once daily.  Patient unable to provide details regarding Valtrex dosage/frequency.  Patient states that he is not currently having a herpes outbreak right now but he states that he is concerned that he may develop one in the future.  Per PDMP review, patient has not had any controlled substance prescriptions prescribed since he had a 15-day supply of 30 oxycodone 5 mg tablets filled on 04/12/2021.  Per PDMP review, no previous evidence of documented Adderall prescriptions.  Patient states that he also takes trazodone 20 to 30 mg p.o. at bedtime as needed for sleep.  Patient states that his Adderall is prescribed by a psychiatrist at [facility] and states that his oxycodone, antihypertensive medications, gabapentin, and trazodone are prescribed by his PCP through [facility].  Per chart review, the most recent prescriptions that I am able to visualize for trazodone and Valtrex are from 2021.  Per chart review, I am not able to visualize any history of any previous gabapentin or Adderall prescriptions.  Patient states that he is able to follow up with his PCP at [facility] and is able to obtain transportation to do so.  Patient reports that he was supposed to have an appointment with his psychiatrist through [facility] yesterday on 08/25/2021, but  he states that he missed this appointment because his car broke down yesterday.  He states he does not have another psychiatry appointment scheduled  yet at this time.  He reports he last saw his psychiatrist at [facility] several months ago.  Patient reports that he has been psychiatrically hospitalized in the past at [facility] several years ago.  He reports that he has a therapist through disability, but is unable to provide further details regarding this at this time.  Patient denies SI on exam.  He denies having any SI recently over the past few months.  He denies history of any previous suicide attempts.  He denies history of self-injurious behavior via intentionally cutting or burning himself.  He denies HI, AVH, or paranoia.  Patient describes his sleep as poor, but is unable to provide details regarding numbers of hours of sleep per night.  He states that he has taken Adderall in the past because it helps him sleep and calms him down and states that he needs Adderall to sleep.  Patient endorses intermittent feelings of anhedonia but denies feelings of guilt, hopelessness, or worthlessness.  He endorses decreased energy, increased appetite.  He denies concentration/focus changes.  He does endorse weight loss of a few pounds over the past 2 to 3 months.  Per chart review, it appears that patient was recently medically admitted at [facility] from 08/23/2021 to 08/25/2021 for refractory nausea and vomiting and hypokalemia after presenting to the [facility] emergency department for nausea/vomiting.  It appears that during this hospital stay, patient received treatment with Zofran and a PPI.  It is also documented that during this hospital stay, patient had CT abdomen/pelvis which showed "Thickened and indistinct appearance at the peripyloric region" and that patient also had a chest x-ray that showed "queried development of hazy airspace opacity within the right lower lung zone".  Per chart review of this hospital admission, it was documented that patient's sister stated patient had not been taking his home medications for multiple  weeks.  It appears that psychiatry was consulted during this hospital admission and patient left the hospital [facility] shortly after patient's telepsych consult was completed and before patient could finish his medical hospitalization.  Per chart review of 08/25/2021 [facility] telemetry psychiatry consult note, it was documented that patient did not mention SI or HI, that patient did not meet IVC criteria at that time, and no recommendations were made at that time for further psychiatric work-up.  It appears it was recommended during this admission for patient to follow up with GI and to also have a follow-up lateral and PA chest x-ray is within 3 to 4 weeks to obtain further details regarding potential underlying pulmonary malignant process.  It also appears that patient was on CIWA protocol during this hospitalization and it was documented during this hospitalization that patient would need EGD at some point due to his GI work-up noted above.  Patient also received IV fluids during this admission as well per my chart review.  Per chart review, it appears that patient's most recent potassium lab value from 08/25/2021 during this hospitalization was decreased at 2.8.  Per chart review, it is documented the patient would receive potassium repletion during this hospital admission, but this writer is unable to verify how much potassium repletion this patient actually received during this hospitalization.  Chart review also shows that patient's lipase level was elevated at 238 on 08/23/2021 during this admission.  When patient is  asked about him leaving AMA from this medical admission noted above, patient states that he thinks that he may need to be checked out again from a medical perspective.  Vital signs stable: BP 132/84, pulse slightly tachycardic at 115, respirations 18, SPO2 100% on room air, temp afebrile at 98.7 F.  Patient reports that he has a home in [place], but he states that he has been  living with his sister and friend named Jomarie Longs in [place] for the past 1 to 2 weeks due to concerns that he has about his daughter who has mental health issues that also lives in [place].  He denies access to firearms.  He endorses drinking one 6 pack of alcohol per week.  He reports that his last alcohol consumption was 4 to 5 days ago in which he states he had 2 beers at that time.  He denies history of alcohol-related withdrawal symptoms.  He does endorse history of previous seizure in August 2022 and he states this was secondary to "being on the wrong medication".  He denies history of seizures secondary to alcohol withdrawal (it is difficult to determine if patient is a reliable historian regarding his alcohol use due to his documented history of alcohol abuse).  Patient endorses smoking 1 pack of cigarettes daily.  Patient denies any illicit substance use.  However, as noted above, patient does have documented history of polysubstance abuse and patient's UDS from 08/24/2021 at [facility] was positive for amphetamines.  Of note, ethanol level on 08/23/2021 was less than 3 mg/dL/within normal limits.  Patient reports that he is unemployed at this time.  Patient states that he does not want to receive detox treatment at this time or participate in substance abuse treatment at this time.  On exam, patient is sitting upright with disheveled appearance, in no acute distress.  Eye contact is good.  Speech is clear and coherent with normal rate although speech is slightly garbled at times.  Speech volume is normal.  Mood is euthymic and "tired" with mood congruent affect.  Thought process is coherent, goal directed, and linear.  Patient is alert and oriented to person, place, time, and situation.  Patient is cooperative and answers all questions asked during the evaluation.  No indication of patient is responding to internal/external stimuli.  No delusional thought content noted.  Psychiatric Specialty Exam  Presentation   General Appearance:Disheveled  Eye Contact:Good  Speech:Clear and Coherent; Normal Rate (Speech slightly garbled at times.)  Speech Volume:Normal  Handedness:No data recorded  Mood and Affect  Mood:Euthymic ("Tired")  Affect:Congruent   Thought Process  Thought Processes:Coherent; Goal Directed; Linear  Descriptions of Associations:Intact  Orientation:Full (Time, Place and Person)  Thought Content:Logical; WDL    Hallucinations:None  Ideas of Reference:None  Suicidal Thoughts:No  Homicidal Thoughts:No   Sensorium  Memory:Immediate Fair; Recent Fair; Remote Fair  Judgment:Fair  Insight:Fair   Executive Functions  Concentration:Fair  Attention Span:Fair  Recall:Fair  Fund of Knowledge:Fair  Language:Fair   Psychomotor Activity  Psychomotor Activity:Normal   Assets  Assets:Communication Skills; Financial Resources/Insurance; Housing; Leisure Time; Physical Health; Social Support   Sleep  Sleep:Poor  Number of hours: No data recorded  No data recorded  Physical Exam: Physical Exam Vitals reviewed.  Constitutional:      General: He is not in acute distress.    Appearance: He is not diaphoretic.  HENT:     Head: Normocephalic and atraumatic.     Right Ear: External ear normal.     Left Ear:  External ear normal.     Nose: Nose normal.  Eyes:     General:        Right eye: No discharge.        Left eye: No discharge.     Conjunctiva/sclera: Conjunctivae normal.  Cardiovascular:     Rate and Rhythm: Tachycardia present.  Pulmonary:     Effort: Pulmonary effort is normal. No respiratory distress.  Musculoskeletal:        General: Normal range of motion.     Cervical back: Normal range of motion.  Neurological:     General: No focal deficit present.     Mental Status: He is alert and oriented to person, place, and time.     Comments: Slight tremor noted of bilateral hands.   Review of Systems  Constitutional:  Positive for  malaise/fatigue and weight loss. Negative for chills, diaphoresis and fever.  HENT:  Negative for congestion.   Respiratory:  Negative for cough and shortness of breath.   Cardiovascular:  Negative for chest pain and palpitations.  Gastrointestinal:  Negative for abdominal pain, constipation, diarrhea, nausea and vomiting.  Musculoskeletal:  Negative for joint pain and myalgias.  Neurological:  Positive for tremors and seizures. Negative for dizziness and headaches.  Psychiatric/Behavioral:  Positive for depression and substance abuse. Negative for hallucinations and suicidal ideas. The patient has insomnia. The patient is not nervous/anxious.   All other systems reviewed and are negative.  Vitals: Blood pressure 132/84, pulse (!) 115, temperature 98.7 F (37.1 C), resp. rate 18, SpO2 100 %. There is no height or weight on file to calculate BMI.  Musculoskeletal: Strength & Muscle Tone: within normal limits Gait & Station:  Slightly unsteady  Patient leans: N/A   [place] MSE Discharge Disposition for Follow up and Recommendations:  Patient denies SI currently on exam.  He denies experiencing any SI recently over the past few weeks.  He denies history of past suicide attempts or self-injurious behavior.  He denies HI, paranoia, or AVH.  Patient is not psychotic or delusional on exam.  Based on these factors, patient is not an imminent danger to himself or others at this time, patient does not meet inpatient psychiatric treatment criteria at this time, and patient is psychiatrically cleared at this time.  Patient states that he does not want to participate in detox treatment or substance abuse treatment at this time.   This provider notified the patient that this provider would not be writing prescriptions for oxycodone or Adderall and that controlled substance prescriptions are not prescribed in our behavioral health urgent care setting.  Also recommended to patient that it is recommended for  patient to follow up with his primary care provider and psychiatrist through [facility] for refills of his home medications noted above in HPI.  Based on my evaluation, the patient does not appear to have an emergent medical condition at this time.  With that being said, when patient is asked about him leaving AMA from the 08/23/2021 to 08/25/2021 [facility] medical admission noted above, patient states that he thinks that he may need to be checked out again from a medical perspective.  Due to patient's request for further medical evaluation and based on patient leaving AMA from his recent hospitalization, will transfer the patient to [facility] emergency department for further medical evaluation/clearance.  Provider report given to Dr. Francia Greaves [facility] ED) via phone, who has agreed to accept the patient.  EMTALA form completed.  Patient is agreeable to  transfer to [facility] ED for medical clearance.  Despite patient declining detox/substance abuse treatment at this time, this provider will provide substance abuse treatment resources in patient's AVS for his [facility] emergency department visit.  Outpatient psychiatry/therapy resources provided to patient as well.  Safety planning done at length with patient regarding appropriate actions to take/resources to utilize [facility], nearest ED, 911, suicide prevention Lifeline) if the patient becomes suicidal or homicidal, if the patient's condition rapidly deteriorates/worsen/does not improve, or if the patient begins to experience a mental health crisis.   Prescilla Sours, PA-C 08/26/2021, 10:34 PM

## 2021-08-26 NOTE — Discharge Instructions (Addendum)
Patient to be transferred to [facility] ED for medical clearance.    Discharge recommendations:  Patient is to take medications as prescribed. Please see information for follow-up appointment with psychiatry and therapy. Please follow up with your primary care provider for all medical related needs.   Therapy: We recommend that patient participate in individual therapy to address mental health concerns.  Medications: The patient is to contact a medical professional and/or outpatient provider to address any new side effects that develop. Patient should update outpatient providers of any new medications and/or medication changes.   Safety:  The patient should abstain from use of illicit substances/drugs and abuse of any medications. If symptoms worsen or do not continue to improve or if the patient becomes actively suicidal or homicidal then it is recommended that the patient return to the closest hospital emergency department, the [facility], or call 911 for further evaluation and treatment. National Suicide Prevention Lifeline 1-800-SUICIDE or (240)200-6611.  About 988 988 offers 24/7 access to trained crisis counselors who can help people experiencing mental health-related distress. People can call or text 988 or chat 988lifeline.org for themselves or if they are worried about a loved one who may need crisis support.

## 2021-08-26 NOTE — Discharge Instructions (Signed)
All controlled substances need to be refilled by the prescribing physician who gives them to you.  Emergency departments do not refill nor manage chronic pain. If you need refills on your blood pressure medication you should also follow-up with your primary care doctor.  Your blood pressure is only moderately elevated today.  You are not having any signs or symptoms of a stroke at this time.  Call your doctor first thing tomorrow morning.  Get help right away if he have any new concerning symptoms.

## 2021-08-26 NOTE — ED Provider Notes (Signed)
[facility] DEPT Provider Note   CSN: [id number] Arrival date & time: 08/26/21  2310     History  Chief Complaint  Patient presents with   Altered Mental Status    Carlos Johnson is a 61 y.o. male.  The history is provided by the patient and medical records.   61 year old male with history of chronic pain syndrome, polysubstance abuse, presenting to the ED from [facility] for medical evaluation.  He was evaluated at [facility] earlier this morning requesting refills of his chronic narcotics.  Apparently, this evening has gone to [facility] asking for refills of oxycodone, adderall, and valtrex.  Facility there did not feel like he met any psychiatric criteria but wanted him medically evaluated.    Home Medications Prior to Admission medications   Medication Sig Start Date End Date Taking? Authorizing Provider  acyclovir (ZOVIRAX) 400 MG tablet Take 1 tablet (400 mg total) by mouth 3 (three) times daily. 09/01/18   Isla Pence, MD  aspirin EC 325 MG tablet Take 1 tablet (325 mg total) by mouth daily. 04/18/21   Isla Pence, MD  cetirizine (ZYRTEC ALLERGY) 10 MG tablet Take 1 tablet (10 mg total) by mouth daily. 09/01/18   Isla Pence, MD  ibuprofen (ADVIL,MOTRIN) 600 MG tablet Take 1 tablet (600 mg total) by mouth every 6 (six) hours as needed. 09/01/18   Isla Pence, MD  omeprazole (PRILOSEC) 20 MG capsule Take 1 capsule (20 mg total) by mouth daily. 09/01/18   Isla Pence, MD  oxyCODONE-acetaminophen (PERCOCET/ROXICET) 5-325 MG tablet Take 1 tablet by mouth. Every 4-6 hours as needed    [provider]      Allergies    Patient has no known allergies.    Review of Systems   Review of Systems  Psychiatric/Behavioral:  Positive for behavioral problems.   All other systems reviewed and are negative.  Physical Exam Updated Vital Signs BP (!) 159/106    Pulse (!) 103    Temp 99.2 F (37.3 C) (Oral)    Resp (!) 28    SpO2 98%   Physical  Exam Vitals and nursing note reviewed.  Constitutional:      Appearance: He is well-developed.     Comments: Asking for adderall  HENT:     Head: Normocephalic and atraumatic.  Eyes:     Conjunctiva/sclera: Conjunctivae normal.     Pupils: Pupils are equal, round, and reactive to light.  Cardiovascular:     Rate and Rhythm: Normal rate and regular rhythm.     Heart sounds: Normal heart sounds.  Pulmonary:     Effort: Pulmonary effort is normal.     Breath sounds: Normal breath sounds.  Abdominal:     General: Bowel sounds are normal.     Palpations: Abdomen is soft.  Musculoskeletal:        General: Normal range of motion.     Cervical back: Normal range of motion.  Skin:    General: Skin is warm and dry.  Neurological:     Mental Status: He is alert and oriented to person, place, and time.  Psychiatric:     Comments: Appears under the influence  Denies SI/HI/AVH    ED Results / Procedures / Treatments   Labs (all labs ordered are listed, but only abnormal results are displayed) Labs Reviewed  CBC WITH DIFFERENTIAL/PLATELET - Abnormal; Notable for the following components:      Result Value   WBC 11.2 (*)    All  other components within normal limits  RAPID URINE DRUG SCREEN, HOSP PERFORMED - Abnormal; Notable for the following components:   Benzodiazepines POSITIVE (*)    Amphetamines POSITIVE (*)    All other components within normal limits  SALICYLATE LEVEL - Abnormal; Notable for the following components:   Salicylate Lvl <8.3 (*)    All other components within normal limits  ACETAMINOPHEN LEVEL - Abnormal; Notable for the following components:   Acetaminophen (Tylenol), Serum <10 (*)    All other components within normal limits  COMPREHENSIVE METABOLIC PANEL - Abnormal; Notable for the following components:   Sodium 133 (*)    Potassium 3.1 (*)    Creatinine, Ser 0.49 (*)    Calcium 8.4 (*)    Total Protein 6.3 (*)    Albumin 3.3 (*)    All other components  within normal limits  ETHANOL    EKG None  Radiology No results found.  Procedures Procedures    Medications Ordered in ED Medications - No data to display  ED Course/ Medical Decision Making/ A&P                           Medical Decision Making Amount and/or Complexity of Data Reviewed Labs: ordered. ECG/medicine tests: ordered.  61 year old male presenting to the ED from  [facility] for medical clearance.  On arrival he is asking for Adderall and appears under the influence.  Reportedly, history of polysubstance abuse.  He was also seen at [facility] ED earlier this morning asking for refill of his oxycodone.  He has a longstanding history of polysubstance abuse which I suspect is contributing to his current presentation.  We will plan for EKG, labs, will monitor.  EKG reviewed, no acute findings.  Labs reviewed, UDS is positive for amphetamines and benzodiazepines.  Chemistry is reassuring, no significant electrolyte derangement.  Ethanol is negative.  Patient has been sleeping throughout majority of ED visit.  Vitals remained stable.  Continues to deny SI/HI.  Feel he is stable for discharge home.  Given resource guide for OP/IP detox centers and follow-up with [place] if desired.  Can return here for new concerns.  Final Clinical Impression(s) / ED Diagnoses Final diagnoses:  Polysubstance abuse [facility])    Rx / [place] Orders ED Discharge Orders     None         Larene Pickett, PA-C 08/27/21 [place], MD 08/27/21 [phone]
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
## 2021-08-26 NOTE — ED Triage Notes (Signed)
The pt is out of his pain med his  pain med was used up yesterday  he is taking oxycodone  for back pain

## 2021-08-26 NOTE — ED Notes (Signed)
Safe transport called to take pt to [facility] ED, report called to Clent Ridges RN charge [employer]

## 2021-08-27 ENCOUNTER — Encounter (HOSPITAL_COMMUNITY): Payer: Self-pay

## 2021-08-27 LAB — COMPREHENSIVE METABOLIC PANEL
ALT: 21 U/L (ref 0–44)
AST: 18 U/L (ref 15–41)
Albumin: 3.3 g/dL — ABNORMAL LOW (ref 3.5–5.0)
Alkaline Phosphatase: 58 U/L (ref 38–126)
Anion gap: 9 (ref 5–15)
BUN: 10 mg/dL (ref 6–20)
CO2: 23 mmol/L (ref 22–32)
Calcium: 8.4 mg/dL — ABNORMAL LOW (ref 8.9–10.3)
Chloride: 101 mmol/L (ref 98–111)
Creatinine, Ser: 0.49 mg/dL — ABNORMAL LOW (ref 0.61–1.24)
GFR, Estimated: >60 mL/min (ref >60)
Glucose, Bld: 97 mg/dL (ref 70–99)
Potassium: 3.1 mmol/L — ABNORMAL LOW (ref 3.5–5.1)
Sodium: 133 mmol/L — ABNORMAL LOW (ref 135–145)
Total Bilirubin: 0.5 mg/dL (ref 0.3–1.2)
Total Protein: 6.3 g/dL — ABNORMAL LOW (ref 6.5–8.1)

## 2021-08-27 LAB — CBC WITH DIFFERENTIAL/PLATELET
Abs Immature Granulocytes: 0.03 10*3/uL (ref 0.00–0.07)
Basophils Absolute: 0 10*3/uL (ref 0.0–0.1)
Basophils Relative: 0 %
Eosinophils Absolute: 0.1 10*3/uL (ref 0.0–0.5)
Eosinophils Relative: 1 %
HCT: 45.7 % (ref 39.0–52.0)
Hemoglobin: 15.6 g/dL (ref 13.0–17.0)
Immature Granulocytes: 0 %
Lymphocytes Relative: 25 %
Lymphs Abs: 2.8 10*3/uL (ref 0.7–4.0)
MCH: 30.3 pg (ref 26.0–34.0)
MCHC: 34.1 g/dL (ref 30.0–36.0)
MCV: 88.7 fL (ref 80.0–100.0)
Monocytes Absolute: 0.8 10*3/uL (ref 0.1–1.0)
Monocytes Relative: 7 %
Neutro Abs: 7.6 10*3/uL (ref 1.7–7.7)
Neutrophils Relative %: 67 %
Platelets: 229 10*3/uL (ref 150–400)
RBC: 5.15 MIL/uL (ref 4.22–5.81)
RDW: 14.4 % (ref 11.5–15.5)
WBC: 11.2 10*3/uL — ABNORMAL HIGH (ref 4.0–10.5)
nRBC: 0 % (ref 0.0–0.2)

## 2021-08-27 LAB — RAPID URINE DRUG SCREEN, HOSP PERFORMED
Amphetamines: POSITIVE — AB
Barbiturates: NOT DETECTED
Benzodiazepines: POSITIVE — AB
Cocaine: NOT DETECTED
Opiates: NOT DETECTED
Tetrahydrocannabinol: NOT DETECTED

## 2021-08-27 LAB — ACETAMINOPHEN LEVEL: Acetaminophen (Tylenol), Serum: <10 ug/mL — ABNORMAL LOW (ref 10–30)

## 2021-08-27 LAB — SALICYLATE LEVEL: Salicylate Lvl: <7 mg/dL — ABNORMAL LOW (ref 7.0–30.0)

## 2021-08-27 LAB — ETHANOL: Alcohol, Ethyl (B): <10 mg/dL (ref <10)

## 2021-08-27 NOTE — Discharge Instructions (Signed)
Resources attached if you would like help with detox. You may follow-up at the [facility] center as well-- information listed. Return here for new concerns.

## 2021-09-09 ENCOUNTER — Telehealth (HOSPITAL_COMMUNITY): Payer: Self-pay

## 2021-09-09 NOTE — BH Assessment (Signed)
Care Management - BHUC Follow Up Discharges   Writer attempted to make contact with patient today and was unsuccessful.  Phone just rang, no voicemail.   Per chart review, patient will follow up with his established  primary care provider and psychiatrist through [facility]

## 2021-09-22 NOTE — Congregational Nurse Program (Signed)
°  Dept: [phone]   Congregational Nurse Program Note  Date of Encounter: 09/22/2021  Past Medical History: Past Medical History:  Diagnosis Date   Anxiety    Depression    GERD (gastroesophageal reflux disease)    Herpes     Encounter Details:  CNP Questionnaire - 09/22/21 1755       Questionnaire   Do you give verbal consent to treat you today? Yes    Location Patient Served  Home of [employer], [employer]    Visit Setting [employer] or Organization    Patient Status Homeless    Insurance [facility]    Insurance Referral N/A    Medication N/A    Medical Provider Yes    Screening Referrals N/A    Medical Referral N/A    Medical Appointment Made N/A    Food Have Food Insecurities    Transportation N/A    Housing/Utilities No permanent housing    Interpersonal Safety N/A    Intervention Blood pressure    ED Visit Averted N/A    Life-Saving Intervention Made N/A           No problems or complaints of pain or discomfort. BP 139/84 HR 79. Jenene Slicker RN

## 2021-09-25 ENCOUNTER — Encounter (HOSPITAL_COMMUNITY): Payer: Self-pay | Admitting: Emergency Medicine

## 2021-09-25 ENCOUNTER — Other Ambulatory Visit: Payer: Self-pay

## 2021-09-25 ENCOUNTER — Emergency Department (HOSPITAL_COMMUNITY)
Admission: EM | Admit: 2021-09-25 | Discharge: 2021-09-28 | Disposition: A | Discharge status: Home / Self Care | Payer: Medicaid Other | Attending: Emergency Medicine | Admitting: Emergency Medicine

## 2021-09-25 DIAGNOSIS — R000 Tachycardia, unspecified: Secondary | ICD-10-CM | POA: Insufficient documentation

## 2021-09-25 DIAGNOSIS — F3113 Bipolar disorder, current episode manic without psychotic features, severe: Secondary | ICD-10-CM | POA: Insufficient documentation

## 2021-09-25 DIAGNOSIS — F17210 Nicotine dependence, cigarettes, uncomplicated: Secondary | ICD-10-CM | POA: Diagnosis not present

## 2021-09-25 DIAGNOSIS — F209 Schizophrenia, unspecified: Secondary | ICD-10-CM | POA: Insufficient documentation

## 2021-09-25 DIAGNOSIS — F1914 Other psychoactive substance abuse with psychoactive substance-induced mood disorder: Secondary | ICD-10-CM | POA: Insufficient documentation

## 2021-09-25 DIAGNOSIS — Z20822 Contact with and (suspected) exposure to covid-19: Secondary | ICD-10-CM | POA: Insufficient documentation

## 2021-09-25 DIAGNOSIS — F1910 Other psychoactive substance abuse, uncomplicated: Secondary | ICD-10-CM | POA: Diagnosis present

## 2021-09-25 DIAGNOSIS — F319 Bipolar disorder, unspecified: Secondary | ICD-10-CM | POA: Diagnosis present

## 2021-09-25 DIAGNOSIS — G4700 Insomnia, unspecified: Secondary | ICD-10-CM | POA: Diagnosis not present

## 2021-09-25 DIAGNOSIS — F1999 Other psychoactive substance use, unspecified with unspecified psychoactive substance-induced disorder: Secondary | ICD-10-CM | POA: Clinically undetermined

## 2021-09-25 LAB — RAPID URINE DRUG SCREEN, HOSP PERFORMED
Amphetamines: POSITIVE — AB
Barbiturates: NOT DETECTED
Benzodiazepines: NOT DETECTED
Cocaine: NOT DETECTED
Opiates: NOT DETECTED
Tetrahydrocannabinol: NOT DETECTED

## 2021-09-25 LAB — CBC
HCT: 45.2 % (ref 39.0–52.0)
Hemoglobin: 15.1 g/dL (ref 13.0–17.0)
MCH: 30.6 pg (ref 26.0–34.0)
MCHC: 33.4 g/dL (ref 30.0–36.0)
MCV: 91.5 fL (ref 80.0–100.0)
Platelets: 272 10*3/uL (ref 150–400)
RBC: 4.94 MIL/uL (ref 4.22–5.81)
RDW: 14.6 % (ref 11.5–15.5)
WBC: 10.4 10*3/uL (ref 4.0–10.5)
nRBC: 0 % (ref 0.0–0.2)

## 2021-09-25 NOTE — ED Triage Notes (Signed)
Pt to the ED with [facility] under involuntary commitment.  IVC paperwork states the patient is confused, refuses to take psychiatric medications,and has been diagnosed with bipolar disorder and schizophrenia.  Patient's family states he is homeless and he just moves from family member to family member.  Pt denies suicidal and homicidal thoughts.

## 2021-09-25 NOTE — ED Notes (Signed)
Pt cussing, talking out loud, pt talking out loud, says he is talking to himself, pt says "only your parents can commit you, right?' This nurse says no that is not true- pt says "oh when I get out of here, I'm just going to start committing people then." Pt continues talking out loud about current situation, rambling continues.

## 2021-09-26 DIAGNOSIS — F319 Bipolar disorder, unspecified: Secondary | ICD-10-CM | POA: Diagnosis present

## 2021-09-26 DIAGNOSIS — F1999 Other psychoactive substance use, unspecified with unspecified psychoactive substance-induced disorder: Secondary | ICD-10-CM | POA: Clinically undetermined

## 2021-09-26 DIAGNOSIS — F1910 Other psychoactive substance abuse, uncomplicated: Secondary | ICD-10-CM | POA: Diagnosis present

## 2021-09-26 DIAGNOSIS — F3113 Bipolar disorder, current episode manic without psychotic features, severe: Secondary | ICD-10-CM

## 2021-09-26 LAB — ETHANOL: Alcohol, Ethyl (B): <10 mg/dL (ref <10)

## 2021-09-26 LAB — COMPREHENSIVE METABOLIC PANEL
ALT: 35 U/L (ref 0–44)
AST: 30 U/L (ref 15–41)
Albumin: 4.2 g/dL (ref 3.5–5.0)
Alkaline Phosphatase: 80 U/L (ref 38–126)
Anion gap: 12 (ref 5–15)
BUN: 16 mg/dL (ref 6–20)
CO2: 22 mmol/L (ref 22–32)
Calcium: 9.4 mg/dL (ref 8.9–10.3)
Chloride: 102 mmol/L (ref 98–111)
Creatinine, Ser: 0.71 mg/dL (ref 0.61–1.24)
GFR, Estimated: >60 mL/min (ref >60)
Glucose, Bld: 120 mg/dL — ABNORMAL HIGH (ref 70–99)
Potassium: 3.5 mmol/L (ref 3.5–5.1)
Sodium: 136 mmol/L (ref 135–145)
Total Bilirubin: 0.8 mg/dL (ref 0.3–1.2)
Total Protein: 7.8 g/dL (ref 6.5–8.1)

## 2021-09-26 LAB — LIPID PANEL
Cholesterol: 182 mg/dL (ref 0–200)
HDL: 68 mg/dL (ref >40)
LDL Cholesterol: 106 mg/dL — ABNORMAL HIGH (ref 0–99)
Total CHOL/HDL Ratio: 2.7 RATIO
Triglycerides: 38 mg/dL (ref <150)
VLDL: 8 mg/dL (ref 0–40)

## 2021-09-26 LAB — ACETAMINOPHEN LEVEL: Acetaminophen (Tylenol), Serum: <10 ug/mL — ABNORMAL LOW (ref 10–30)

## 2021-09-26 LAB — SALICYLATE LEVEL: Salicylate Lvl: <7 mg/dL — ABNORMAL LOW (ref 7.0–30.0)

## 2021-09-26 MED ORDER — HYDROXYZINE HCL 25 MG PO TABS
50.0000 mg | ORAL_TABLET | Freq: Once | ORAL | Status: AC
  Administered 2021-09-26: 50 mg via ORAL
  Filled 2021-09-26: qty 2

## 2021-09-26 MED ORDER — THIAMINE HCL 100 MG PO TABS
100.0000 mg | ORAL_TABLET | Freq: Every day | ORAL | Status: DC
  Administered 2021-09-26 – 2021-09-27 (×2): 100 mg via ORAL
  Filled 2021-09-26 (×3): qty 1

## 2021-09-26 MED ORDER — ONDANSETRON HCL 4 MG PO TABS
4.0000 mg | ORAL_TABLET | Freq: Three times a day (TID) | ORAL | Status: DC | PRN
  Administered 2021-09-27: 4 mg via ORAL
  Filled 2021-09-26: qty 1

## 2021-09-26 MED ORDER — ADULT MULTIVITAMIN W/MINERALS CH
1.0000 | ORAL_TABLET | Freq: Every day | ORAL | Status: DC
  Administered 2021-09-26 – 2021-09-28 (×3): 1 via ORAL
  Filled 2021-09-26 (×3): qty 1

## 2021-09-26 MED ORDER — THIAMINE HCL 100 MG/ML IJ SOLN: 100.0000 mg | Freq: Every day | INTRAMUSCULAR | Status: DC

## 2021-09-26 MED ORDER — ZOLPIDEM TARTRATE 5 MG PO TABS
5.0000 mg | ORAL_TABLET | Freq: Every evening | ORAL | Status: DC | PRN
  Administered 2021-09-26: 5 mg via ORAL
  Filled 2021-09-26: qty 1

## 2021-09-26 MED ORDER — PANTOPRAZOLE SODIUM 40 MG PO TBEC
40.0000 mg | DELAYED_RELEASE_TABLET | Freq: Every day | ORAL | Status: DC
  Administered 2021-09-26 – 2021-09-28 (×3): 40 mg via ORAL
  Filled 2021-09-26 (×3): qty 1

## 2021-09-26 MED ORDER — ACETAMINOPHEN 325 MG PO TABS
650.0000 mg | ORAL_TABLET | ORAL | Status: DC | PRN
  Administered 2021-09-28: 650 mg via ORAL
  Filled 2021-09-26: qty 2

## 2021-09-26 MED ORDER — NICOTINE 14 MG/24HR TD PT24
14.0000 mg | MEDICATED_PATCH | Freq: Every day | TRANSDERMAL | Status: DC
  Administered 2021-09-26 – 2021-09-28 (×3): 14 mg via TRANSDERMAL
  Filled 2021-09-26 (×3): qty 1

## 2021-09-26 MED ORDER — LORAZEPAM 1 MG PO TABS
2.0000 mg | ORAL_TABLET | Freq: Once | ORAL | Status: AC
  Administered 2021-09-26: 2 mg via ORAL
  Filled 2021-09-26: qty 2

## 2021-09-26 MED ORDER — LORAZEPAM 1 MG PO TABS
1.0000 mg | ORAL_TABLET | ORAL | Status: DC | PRN
  Administered 2021-09-27: 1 mg via ORAL
  Filled 2021-09-26: qty 1

## 2021-09-26 MED ORDER — ALUM & MAG HYDROXIDE-SIMETH 200-200-20 MG/5ML PO SUSP: 30.0000 mL | Freq: Four times a day (QID) | ORAL | Status: DC | PRN

## 2021-09-26 MED ORDER — ASPIRIN EC 325 MG PO TBEC
325.0000 mg | DELAYED_RELEASE_TABLET | Freq: Every day | ORAL | Status: DC
  Administered 2021-09-26 – 2021-09-28 (×3): 325 mg via ORAL
  Filled 2021-09-26 (×3): qty 1

## 2021-09-26 MED ORDER — FOLIC ACID 1 MG PO TABS
1.0000 mg | ORAL_TABLET | Freq: Every day | ORAL | Status: DC
  Administered 2021-09-26 – 2021-09-28 (×3): 1 mg via ORAL
  Filled 2021-09-26 (×3): qty 1

## 2021-09-26 MED ORDER — LORAZEPAM 2 MG/ML IJ SOLN: 1.0000 mg | INTRAMUSCULAR | Status: DC | PRN

## 2021-09-26 MED ORDER — DILTIAZEM HCL ER COATED BEADS 180 MG PO CP24
180.0000 mg | ORAL_CAPSULE | Freq: Every day | ORAL | Status: DC
  Administered 2021-09-26 – 2021-09-28 (×3): 180 mg via ORAL
  Filled 2021-09-26 (×3): qty 1

## 2021-09-26 MED ORDER — HALOPERIDOL 5 MG PO TABS
5.0000 mg | ORAL_TABLET | Freq: Four times a day (QID) | ORAL | Status: DC | PRN
  Administered 2021-09-26 – 2021-09-28 (×3): 5 mg via ORAL
  Filled 2021-09-26 (×3): qty 1

## 2021-09-26 MED ORDER — OLANZAPINE 5 MG PO TBDP
10.0000 mg | ORAL_TABLET | Freq: Every day | ORAL | Status: DC
  Administered 2021-09-26 – 2021-09-28 (×3): 10 mg via ORAL
  Filled 2021-09-26 (×3): qty 2

## 2021-09-26 NOTE — ED Notes (Signed)
Pt has been given 2 diet drinks and graham crackers. Pt asking over and over about wanting to call someone and about rules. Guidelines have been given to him. Charge nurse came in to explain about phone privileges and visitors.

## 2021-09-26 NOTE — ED Notes (Signed)
Patient is up to the bathroom. Assisted by sitter

## 2021-09-26 NOTE — Consult Note (Addendum)
Telepsych Consultation   Reason for Consult:  psych consult Referring Physician:  Dione Booze, MD Location of Patient:  [facility] [phone] Location of Provider: Behavioral Health TTS Department  Patient Identification: Rogen Porte MRN:  [id number] Principal Diagnosis: Bipolar affective disorder, currently active [facility]) Diagnosis:  Principal Problem:   Bipolar affective disorder, currently active [facility]) Active Problems:   Polysubstance abuse (HCC)   Substance-induced disorder (HCC)   Total Time spent with patient: 20 minutes  Subjective:   Faron Tudisco is a 61 y.o. male patient admitted with bipolar disorder.  Patient presents sitting on side of the bed.  Susie Cassette my family, they don't to hear the truth about life. I came in here to prove a point. But I won't come back anymore". Patient continues to state he's from [facility], [place]). Says dad Chief Technology Officer) has been in hospital for the past 6 weeks, he's only son with 6 sisters. Admits using methamphetamine and heroin; says "I have to have methamphetamine everyday. I'll use that until the day I die. Have to have it every morning with my coffee".  Endorses history of "freebasing cocaine, but I don't do that no more". Denies history of schizophrenia, says he has "little bit" of bipolar. States he goes to [employer] "they come to me". He denies any suicidal or homicidal ideations, auditory or visual hallucinations. Patient remains elevated and hyper-verbal; speech tangential and pressured. Hard to redirect. Recommendation for continued inpatient.   HPI:  RAMAR NOBREGA is a 61 year old male with a past psychiatric history of anxiety, depression, polysubstance use who presented to [facility] via law enforcement under IVC by family for confusion, medication non-compliance. Of note family reports history of bipolar and schizophrenia diagnoses, per chart review patient has history of CVA (2022), heroin overdose, altered mental status,  substance induced disorders. UDS+amphetamine, BAL<10. PDMP reviewed, no recent rx.   Past Psychiatric History: bipolar (self reported), polysubstance use, substance induced disorder  Risk to Self:  yes Risk to Others:  yes Prior Inpatient Therapy:  yes Prior Outpatient Therapy:  yes  Past Medical History:  Past Medical History:  Diagnosis Date   Anxiety    Depression    GERD (gastroesophageal reflux disease)    Herpes     Past Surgical History:  Procedure Laterality Date   SHOULDER SURGERY Right 2001   TONSILLECTOMY Bilateral    WISDOM TOOTH EXTRACTION Bilateral    Family History:  Family History  Problem Relation Age of Onset   Asthma Mother    Cancer Father        lung cancer   Hypertension Father    Asthma Daughter    Family Psychiatric  History: not noted Social History:  Social History   Substance and Sexual Activity  Alcohol Use Yes   Alcohol/week: 21.0 standard drinks   Types: 21 Cans of beer per week   Comment: occ     Social History   Substance and Sexual Activity  Drug Use Yes   Types: Oxycodone, Hydrocodone, Heroin, Nitrous oxide   Comment:  pt denies use 02-08-18    Social History   Socioeconomic History   Marital status: Single    Spouse name: Not on file   Number of children: Not on file   Years of education: Not on file   Highest education level: Not on file  Occupational History   Not on file  Tobacco Use   Smoking status: Every Day    Packs/day: 0.50  Years: 36.00    Pack years: 18.00    Types: Cigarettes   Smokeless tobacco: Never  Vaping Use   Vaping Use: Never used  Substance and Sexual Activity   Alcohol use: Yes    Alcohol/week: 21.0 standard drinks    Types: 21 Cans of beer per week    Comment: occ   Drug use: Yes    Types: Oxycodone, Hydrocodone, Heroin, Nitrous oxide    Comment:  pt denies use 02-08-18   Sexual activity: Not on file  Other Topics Concern   Not on file  Social History Narrative   Not on file    Social Determinants of Health   Financial Resource Strain: Not on file  Food Insecurity: Not on file  Transportation Needs: Not on file  Physical Activity: Not on file  Stress: Not on file  Social Connections: Not on file   Additional Social History:    Allergies:  No Known Allergies  Labs:  Results for orders placed or performed during the hospital encounter of 09/25/21 (from the past 48 hour(s))  Rapid urine drug screen (hospital performed)     Status: Abnormal   Collection Time: 09/25/21 10:12 PM  Result Value Ref Range   Opiates NONE DETECTED NONE DETECTED   Cocaine NONE DETECTED NONE DETECTED   Benzodiazepines NONE DETECTED NONE DETECTED   Amphetamines POSITIVE (A) NONE DETECTED   Tetrahydrocannabinol NONE DETECTED NONE DETECTED   Barbiturates NONE DETECTED NONE DETECTED    Comment: (NOTE) DRUG SCREEN FOR MEDICAL PURPOSES ONLY.  IF CONFIRMATION IS NEEDED FOR ANY PURPOSE, NOTIFY LAB WITHIN 5 DAYS.  LOWEST DETECTABLE LIMITS FOR URINE DRUG SCREEN Drug Class                     Cutoff (ng/mL) Amphetamine and metabolites    1000 Barbiturate and metabolites    200 Benzodiazepine                 200 Tricyclics and metabolites     300 Opiates and metabolites        300 Cocaine and metabolites        300 THC                            50 Performed at [facility], [address], [place], [place] [address]   Comprehensive metabolic panel     Status: Abnormal   Collection Time: 09/25/21 11:38 PM  Result Value Ref Range   Sodium 136 135 - 145 mmol/L   Potassium 3.5 3.5 - 5.1 mmol/L   Chloride 102 98 - 111 mmol/L   CO2 22 22 - 32 mmol/L   Glucose, Bld 120 (H) 70 - 99 mg/dL    Comment: Glucose reference range applies only to samples taken after fasting for at least 8 hours.   BUN 16 6 - 20 mg/dL   Creatinine, Ser 9.62 0.61 - 1.24 mg/dL   Calcium 9.4 8.9 - 22.9 mg/dL   Total Protein 7.8 6.5 - 8.1 g/dL   Albumin 4.2 3.5 - 5.0 g/dL   AST 30 15 - 41 U/L   ALT 35 0 -  44 U/L   Alkaline Phosphatase 80 38 - 126 U/L   Total Bilirubin 0.8 0.3 - 1.2 mg/dL   GFR, Estimated >79 >89 mL/min    Comment: (NOTE) Calculated using the CKD-EPI Creatinine Equation (2021)    Anion gap 12 5 - 15  Comment: Performed at [facility], [address] [place], [place] [address]  Ethanol     Status: None   Collection Time: 09/25/21 11:38 PM  Result Value Ref Range   Alcohol, Ethyl (B) <10 <10 mg/dL    Comment: (NOTE) Lowest detectable limit for serum alcohol is 10 mg/dL.  For medical purposes only. Performed at [facility], [address] [place], [place] [address]   Salicylate level     Status: Abnormal   Collection Time: 09/25/21 11:38 PM  Result Value Ref Range   Salicylate Lvl <7.0 (L) 7.0 - 30.0 mg/dL    Comment: Performed at [facility], [address] [place], [place] [address]  Acetaminophen level     Status: Abnormal   Collection Time: 09/25/21 11:38 PM  Result Value Ref Range   Acetaminophen (Tylenol), Serum <10 (L) 10 - 30 ug/mL    Comment: (NOTE) Therapeutic concentrations vary significantly. A range of 10-30 ug/mL  may be an effective concentration for many patients. However, some  are best treated at concentrations outside of this range. Acetaminophen concentrations >150 ug/mL at 4 hours after ingestion  and >50 ug/mL at 12 hours after ingestion are often associated with  toxic reactions.  Performed at [facility], [address] [place], [place] [address]   cbc     Status: None   Collection Time: 09/25/21 11:38 PM  Result Value Ref Range   WBC 10.4 4.0 - 10.5 K/uL   RBC 4.94 4.22 - 5.81 MIL/uL   Hemoglobin 15.1 13.0 - 17.0 g/dL   HCT 29.545.2 62.139.0 - 30.852.0 %   MCV 91.5 80.0 - 100.0 fL   MCH 30.6 26.0 - 34.0 pg   MCHC 33.4 30.0 - 36.0 g/dL   RDW 65.714.6 84.611.5 - 96.215.5 %   Platelets 272 150 - 400 K/uL   nRBC 0.0 0.0 - 0.2 %    Comment: Performed at [facility], [address] [place], [place] [address]  Lipid panel     Status: Abnormal    Collection Time: 09/26/21  9:13 AM  Result Value Ref Range   Cholesterol 182 0 - 200 mg/dL   Triglycerides 38 <132<150 mg/dL   HDL 68 >44>40 mg/dL   Total CHOL/HDL Ratio 2.7 RATIO   VLDL 8 0 - 40 mg/dL   LDL Cholesterol 010106 (H) 0 - 99 mg/dL    Comment:        Total Cholesterol/HDL:CHD Risk Coronary Heart Disease Risk Table                     Men   Women  1/2 Average Risk   3.4   3.3  Average Risk       5.0   4.4  2 X Average Risk   9.6   7.1  3 X Average Risk  23.4   11.0        Use the calculated Patient Ratio above and the CHD Risk Table to determine the patient's CHD Risk.        ATP III CLASSIFICATION (LDL):  <100     mg/dL   Optimal  [id number]  mg/dL   Near or Above                    Optimal  130-159  mg/dL   Borderline  [id number]  mg/dL   High  >742>190     mg/dL   Very High Performed at [facility], [address] [place],  [place] [address]     Medications:  Current Facility-Administered Medications  Medication Dose Route Frequency Provider Last Rate Last Admin   acetaminophen (TYLENOL) tablet 650 mg  650 mg Oral Q4H PRN Dione Booze, MD       alum & mag hydroxide-simeth (MAALOX/MYLANTA) 200-200-20 MG/5ML suspension 30 mL  30 mL Oral Q6H PRN Dione Booze, MD       aspirin EC tablet 325 mg  325 mg Oral Daily Dione Booze, MD   325 mg at 09/26/21 0915   diltiazem (CARDIZEM CD) 24 hr capsule 180 mg  180 mg Oral Daily Tanda Rockers A, DO   180 mg at 09/26/21 1106   folic acid (FOLVITE) tablet 1 mg  1 mg Oral Daily Tanda Rockers A, DO   1 mg at 09/26/21 1106   haloperidol (HALDOL) tablet 5 mg  5 mg Oral Q6H PRN Dione Booze, MD   5 mg at 09/26/21 0200   LORazepam (ATIVAN) tablet 1-4 mg  1-4 mg Oral Q1H PRN Sloan Leiter, DO       Or   LORazepam (ATIVAN) injection 1-4 mg  1-4 mg Intravenous Q1H PRN Tanda Rockers A, DO       multivitamin with minerals tablet 1 tablet  1 tablet Oral Daily Tanda Rockers A, DO   1 tablet at 09/26/21 1106   nicotine (NICODERM CQ - dosed in mg/24 hours)  patch 14 mg  14 mg Transdermal Daily Dione Booze, MD   14 mg at 09/26/21 0915   OLANZapine zydis (ZYPREXA) disintegrating tablet 10 mg  10 mg Oral Daily Leevy-Johnson, Gaylene Moylan A, NP   10 mg at 09/26/21 0915   ondansetron (ZOFRAN) tablet 4 mg  4 mg Oral Q8H PRN Dione Booze, MD       pantoprazole (PROTONIX) EC tablet 40 mg  40 mg Oral Daily Dione Booze, MD   40 mg at 09/26/21 0915   thiamine tablet 100 mg  100 mg Oral Daily Tanda Rockers A, DO   100 mg at 09/26/21 1105   Or   thiamine (B-1) injection 100 mg  100 mg Intravenous Daily Sloan Leiter, DO       Current Outpatient Medications  Medication Sig Dispense Refill   acyclovir (ZOVIRAX) 400 MG tablet Take 1 tablet (400 mg total) by mouth 3 (three) times daily. 21 tablet 1   aspirin EC 325 MG tablet Take 1 tablet (325 mg total) by mouth daily. 30 tablet 0   cetirizine (ZYRTEC ALLERGY) 10 MG tablet Take 1 tablet (10 mg total) by mouth daily. 30 tablet 0   ibuprofen (ADVIL,MOTRIN) 600 MG tablet Take 1 tablet (600 mg total) by mouth every 6 (six) hours as needed. 30 tablet 0   omeprazole (PRILOSEC) 20 MG capsule Take 1 capsule (20 mg total) by mouth daily. 30 capsule 1   oxyCODONE-acetaminophen (PERCOCET/ROXICET) 5-325 MG tablet Take 1 tablet by mouth. Every 4-6 hours as needed      Musculoskeletal: Strength & Muscle Tone: within normal limits Gait & Station: normal Patient leans: N/A  Psychiatric Specialty Exam:  Presentation  General Appearance: Casual  Eye Contact:Fair  Speech:Pressured  Speech Volume:Normal  Handedness:No data recorded  Mood and Affect  Mood:Labile  Affect:Other (comment) (elevated)   Thought Process  Thought Processes:Other (comment) (tangential)  Descriptions of Associations:Loose  Orientation:Full (Time, Place and Person)  Thought Content:Tangential  History of Schizophrenia/Schizoaffective disorder:No data recorded Duration of Psychotic Symptoms:No data  recorded Hallucinations:Hallucinations: None  Ideas of Reference:None  Suicidal Thoughts:Suicidal  Thoughts: No  Homicidal Thoughts:Homicidal Thoughts: No   Sensorium  Memory:Immediate Fair; Recent Fair; Remote Fair  Judgment:Poor  Insight:Shallow; Poor   Executive Functions  Concentration:Poor  Attention Span:Poor  Recall:Fair  [employer] of Knowledge:Fair  Language:Fair   Psychomotor Activity  Psychomotor Activity:Psychomotor Activity: Increased  Assets  Assets:Physical Health; Resilience   Sleep  Sleep:Sleep: Poor   Physical Exam: Physical Exam Vitals and nursing note reviewed.  Constitutional:      Appearance: Normal appearance.  HENT:     Head: Normocephalic.     Nose: Nose normal.     Mouth/Throat:     Mouth: Mucous membranes are dry.  Eyes:     Pupils: Pupils are equal, round, and reactive to light.  Cardiovascular:     Rate and Rhythm: Tachycardia present.  Pulmonary:     Effort: Pulmonary effort is normal.  Abdominal:     General: Abdomen is flat.  Musculoskeletal:        General: Normal range of motion.     Cervical back: Normal range of motion.  Skin:    General: Skin is dry.  Neurological:     Mental Status: He is alert and oriented to person, place, and time.  Psychiatric:        Attention and Perception: He is inattentive.        Mood and Affect: Affect is labile and inappropriate.        Speech: Speech is rapid and pressured and tangential.        Behavior: Behavior is hyperactive. Behavior is cooperative.        Thought Content: Thought content is delusional. Thought content does not include homicidal or suicidal ideation. Thought content does not include homicidal or suicidal plan.        Cognition and Memory: Cognition is impaired.        Judgment: Judgment is impulsive and inappropriate.   Review of Systems  Psychiatric/Behavioral:  Positive for hallucinations and substance abuse. The patient has insomnia.   All other systems  reviewed and are negative. Blood pressure (!) 168/101, pulse (!) 101, temperature 98.1 F (36.7 C), temperature source Oral, resp. rate (!) 24, height 5\' 7"  (1.702 m), weight 71.3 kg, SpO2 100 %. Body mass index is 24.62 kg/m.  Treatment Plan Summary: Daily contact with patient to assess and evaluate symptoms and progress in treatment, Medication management, and Plan continue to seek inpatient psychiatric hospitalization for further observation, stabilization, and treatment. Zyprexa 10mg  started to address symptoms.    Disposition: Recommend psychiatric Inpatient admission when medically cleared. Supportive therapy provided about ongoing stressors. Discussed crisis plan, support from social network, calling 911, coming to the Emergency Department, and calling Suicide Hotline.  This service was provided via telemedicine using a 2-way, interactive audio and video technology.  Names of all persons participating in this telemedicine service and their role in this encounter. Name: Maxie BarbBrooke Leevy-Johnson Role: PMHNP  Name: Claris CheMargaret Cinderella Role: Attending MD  Name: Ivin PootMark A Tucker Role: patient  Name:  Role:     Loletta ParishBrooke A Leevy-Johnson, NP 09/26/2021 11:26 AM

## 2021-09-26 NOTE — ED Notes (Signed)
This nurse asked pt if he needed something to calm him down and help him sleep, pt responded, "I need an opioid" or "a beer". This nurse offered tylenol for pain, pt refused.

## 2021-09-26 NOTE — ED Notes (Signed)
Pt resting comfortably

## 2021-09-26 NOTE — ED Notes (Signed)
Pt eating frozen dinner tray and given beverage- pt talking nonstop to sitter, Zara at this time.

## 2021-09-26 NOTE — ED Notes (Signed)
Patient ambulated to the bathroom for a shower.

## 2021-09-26 NOTE — ED Notes (Signed)
Patient currently in bed asleep.

## 2021-09-26 NOTE — ED Notes (Signed)
IVC paperwork faxed to [facility] @ [phone] and [phone] 

## 2021-09-26 NOTE — ED Notes (Signed)
MD at bedside. Pt continuously rambling about his family members and an inheritance.

## 2021-09-26 NOTE — [facility] Counselor (Signed)
Attempted to see pt at 6:20am, per  [facility] EMT-P patient sleeping. TTS will attempt to see pt later this day.

## 2021-09-26 NOTE — ED Notes (Signed)
Back in room from taking a shower. Bed linens changed, new scrubs offered. Patient is now eating dinner.

## 2021-09-26 NOTE — ED Notes (Addendum)
Pt is sleeping but I went in to wake him up and asked if he was willing to do his TTS. Pt began rambling and asking me my name. LCAS Dubose states that she will try TTS later when pt is fully awake.

## 2021-09-26 NOTE — ED Notes (Signed)
Patient standing at door requesting to go outside for a cigarette/ Patient has been reminded multiple times that he cannot have a cigarette. Patient has a nicotine patch in place.

## 2021-09-26 NOTE — ED Notes (Signed)
Patient being assessed by TTS at this time.  

## 2021-09-26 NOTE — ED Provider Notes (Signed)
[facility] EMERGENCY DEPARTMENT Provider Note   CSN: [id number] Arrival date & time: 09/25/21  2133     History  Chief complaint: Under involuntary commitment  Carlos Johnson is a 61 y.o. male.  The history is provided by the patient and medical records. The history is limited by the condition of the patient (Psychiatric disorder).  He has history of anxiety, depression, GERD and was brought in by police under IVC papers.  Per IVC papers, he has a diagnosis of bipolar disorder and schizophrenia and has not been taking his medications and family is concerned about his safety as well as safety of people who would come in contact with him.  Patient does not give a consistent answer when asked about if he is taking his medications.  He denies alcohol and drug use.  He denies suicidal or homicidal ideation.  He denies hallucinations.  He does admit to some paranoid ideation.   Home Medications Prior to Admission medications   Medication Sig Start Date End Date Taking? Authorizing Provider  acyclovir (ZOVIRAX) 400 MG tablet Take 1 tablet (400 mg total) by mouth 3 (three) times daily. 09/01/18   Jacalyn Lefevre, MD  aspirin EC 325 MG tablet Take 1 tablet (325 mg total) by mouth daily. 04/18/21   Jacalyn Lefevre, MD  cetirizine (ZYRTEC ALLERGY) 10 MG tablet Take 1 tablet (10 mg total) by mouth daily. 09/01/18   Jacalyn Lefevre, MD  ibuprofen (ADVIL,MOTRIN) 600 MG tablet Take 1 tablet (600 mg total) by mouth every 6 (six) hours as needed. 09/01/18   Jacalyn Lefevre, MD  omeprazole (PRILOSEC) 20 MG capsule Take 1 capsule (20 mg total) by mouth daily. 09/01/18   Jacalyn Lefevre, MD  oxyCODONE-acetaminophen (PERCOCET/ROXICET) 5-325 MG tablet Take 1 tablet by mouth. Every 4-6 hours as needed    [provider]      Allergies    Patient has no known allergies.    Review of Systems   Review of Systems  Unable to perform ROS: Psychiatric disorder   Physical Exam Updated Vital Signs BP  (!) 163/116 (BP Location: Right Arm)    Pulse (!) 105    Temp 98.8 F (37.1 C) (Temporal)    Resp 20    Ht 5\' 7"  (1.702 m)    Wt 71.3 kg    SpO2 100%    BMI 24.62 kg/m  Physical Exam Vitals and nursing note reviewed.    ED Results / Procedures / Treatments   Labs (all labs ordered are listed, but only abnormal results are displayed) Labs Reviewed  COMPREHENSIVE METABOLIC PANEL - Abnormal; Notable for the following components:      Result Value   Glucose, Bld 120 (*)    All other components within normal limits  SALICYLATE LEVEL - Abnormal; Notable for the following components:   Salicylate Lvl <7.0 (*)    All other components within normal limits  ACETAMINOPHEN LEVEL - Abnormal; Notable for the following components:   Acetaminophen (Tylenol), Serum <10 (*)    All other components within normal limits  RAPID URINE DRUG SCREEN, HOSP PERFORMED - Abnormal; Notable for the following components:   Amphetamines POSITIVE (*)    All other components within normal limits  RESP PANEL BY RT-PCR (FLU A&B, COVID) ARPGX2  ETHANOL  CBC    Procedures Procedures    Medications Ordered in ED Medications  acetaminophen (TYLENOL) tablet 650 mg (has no administration in time range)  zolpidem (AMBIEN) tablet 5 mg (has no administration in  time range)  ondansetron (ZOFRAN) tablet 4 mg (has no administration in time range)  alum & mag hydroxide-simeth (MAALOX/MYLANTA) 200-200-20 MG/5ML suspension 30 mL (has no administration in time range)  nicotine (NICODERM CQ - dosed in mg/24 hours) patch 14 mg (has no administration in time range)  aspirin EC tablet 325 mg (has no administration in time range)  pantoprazole (PROTONIX) EC tablet 40 mg (has no administration in time range)  haloperidol (HALDOL) tablet 5 mg (has no administration in time range)    ED Course/ Medical Decision Making/ A&P                           Medical Decision Making Amount and/or Complexity of Data Reviewed Labs:  ordered.  Risk OTC drugs. Prescription drug management.   History of bipolar disorder and schizophrenia with apparent mania.  Patient is talking nonstop and is somewhat agitated in the emergency department.  Old records reviewed, and I do not see any relevant past visits.  He is given a dose of haloperidol for his agitation.  Laboratory work-up is unremarkable, drug screen is negative.  We will request TTS consultation, and I expect that he will need inpatient psychiatric care.  Labs are unremarkable.  TTS consultation is pending.        Final Clinical Impression(s) / ED Diagnoses Final diagnoses:  Bipolar disorder, current episode manic without psychotic features, severe [facility])    Rx / DC Orders ED Discharge Orders     None         Dione Booze, MD 09/26/21 [phone]

## 2021-09-26 NOTE — Progress Notes (Signed)
Per Leroy Sea, patient meets criteria for inpatient treatment. There are no available or appropriate beds at [facility] today. CSW faxed referrals to the following facilities for review:  [facility]  Pending - No Request Sent N/A [address], [place] [address] [phone] --  [facility]  Pending - No Request Sent N/A [address], [place] [address] [phone] --  [facility]  Pending - No Request [facility] [address] [place] [address] [phone] --  [facility]  Pending - No Request Sent N/A [address] [place] [address] [phone] --  [facility]  Pending - No Request Sent N/A [address] [place] [address] [phone] --  [facility]  Pending - No Request Sent N/A [address] [place], [place] [address] [phone] --  [facility]  Pending - No Request Sent N/A [address] [place] [address] [phone] --  [facility]  Pending - No Request Sent N/A [address] [place] [address] [phone] --  [facility]  Pending - No Request Sent N/A [address] [place] [address] [phone] --  [facility]  Pending - No Request Sent N/A [address] [place] [address] [phone] --  [facility]  Pending - No Request Sent N/A [address], [place] [address] [phone] --  [facility]  Pending - No Request Sent N/A [address], [place] [address] [phone] --  [facility]  Pending - No Request Sent N/A [address] [place] [address] [phone] --  [facility]  Pending - No Request Sent N/A [address] [place] [address] [phone] --  [facility]  Pending - No Request Sent N/A [address] [place] [address] [phone] --  [facility]  Pending - No Request Sent N/A [address] [place] [address] [phone] --    TTS will continue to seek bed placement.  Crissie Reese, MSW, LCSW-A, LCAS-A Phone: [phone] Disposition/TOC

## 2021-09-26 NOTE — ED Notes (Signed)
IVC paperwork faxed to [facility] 

## 2021-09-27 LAB — RESP PANEL BY RT-PCR (FLU A&B, COVID) ARPGX2
Influenza A by PCR: NEGATIVE
Influenza B by PCR: NEGATIVE
SARS Coronavirus 2 by RT PCR: NEGATIVE

## 2021-09-27 MED ORDER — ONDANSETRON 4 MG PO TBDP
4.0000 mg | ORAL_TABLET | Freq: Once | ORAL | Status: AC
  Administered 2021-09-27: 4 mg via ORAL
  Filled 2021-09-27: qty 1

## 2021-09-27 NOTE — ED Notes (Signed)
Pt vomitting.. I went in his room and asked if he was okay. Pt says that he is fine and no longer feels sick. Emesis bags left in the room with him. Pt now sleeping again.

## 2021-09-27 NOTE — ED Notes (Signed)
Pt vomited on room floor and in trash can. Housekeeping called to clean floor. MD made aware and Zofran ordered.

## 2021-09-27 NOTE — ED Notes (Signed)
Cleaned vomit off of pt's face and the floor after giving him zofran. Pt now resting.

## 2021-09-27 NOTE — ED Notes (Signed)
Changed soiled bedding and scrub top.

## 2021-09-27 NOTE — ED Notes (Signed)
Pt given apple juice  

## 2021-09-27 NOTE — ED Notes (Signed)
15 was given a frozen dinner meal along with a fruit cup and 2 juices at this time. Carlos Johnson

## 2021-09-28 DIAGNOSIS — F1999 Other psychoactive substance use, unspecified with unspecified psychoactive substance-induced disorder: Secondary | ICD-10-CM

## 2021-09-28 DIAGNOSIS — F1910 Other psychoactive substance abuse, uncomplicated: Secondary | ICD-10-CM

## 2021-09-28 MED ORDER — OLANZAPINE 10 MG PO TBDP: 10.0000 mg | ORAL_TABLET | Freq: Every day | ORAL | 0 refills | Status: DC

## 2021-09-28 NOTE — ED Provider Notes (Signed)
°  Physical Exam  BP (!) 154/108 (BP Location: Right Arm)    Pulse (!) 106    Temp 98.3 F (36.8 C) (Oral)    Resp 18    Ht 5\' 7"  (1.702 m)    Wt 71.3 kg    SpO2 99%    BMI 24.62 kg/m   Physical Exam  Procedures  Procedures  ED Course / MDM    Medical Decision Making Amount and/or Complexity of Data Reviewed Labs: ordered.  Risk OTC drugs. Prescription drug management.   Patient pending inpatient psychiatric placement.  Reportedly vomited last night.  Appears as if he has been sent out to 15 places but not found a bed yet.  It is a holiday Monday today but hopefully some progress can be made.       Monday, MD 09/28/21 [phone]

## 2021-09-28 NOTE — ED Notes (Signed)
TTS in progress 

## 2021-09-28 NOTE — ED Notes (Addendum)
Discharge instructions reviewed with patient. Patient verbalized understanding. All patient belongings returned to patient.

## 2021-09-28 NOTE — ED Notes (Signed)
Pt escorted to bathroom 

## 2021-09-28 NOTE — ED Notes (Signed)
Pt walking out in hallway. Pt knows another patient which was brought in by [employer]. Pt laughing at patient and other patient becoming upset and yelling. Pt asked by security and myself to stay in room. Pt agreed.

## 2021-09-28 NOTE — ED Notes (Signed)
Pt breakfast at bedside 

## 2021-09-28 NOTE — ED Notes (Signed)
Pt went to bathroom

## 2021-09-28 NOTE — ED Notes (Signed)
Pt wandering out of room asking about other patient's location. Informed I couldn't tell him about other patients. Pt refusing to return to his room until we called security and [employer]. Security called and at bedside to escort patient back to room.

## 2021-09-28 NOTE — Consult Note (Signed)
Telepsych Consultation   Reason for Consult:  Involuntary commitment Referring Physician:  Dione Booze, MD Location of Patient: [facility] Location of Provider: Behavioral Health TTS Department  Patient Identification: Axten Jardon MRN:  [id number] Principal Diagnosis: Bipolar affective disorder, currently active [facility]) Diagnosis:  Principal Problem:   Bipolar affective disorder, currently active [facility]) Active Problems:   Polysubstance abuse (HCC)   Substance-induced disorder (HCC)   Total Time spent with patient: 30 minutes  Subjective:   Januel Nuon is a 61 y.o. male patient admitted with involuntary commitment by family for concern of substance abuse and medication non-compliance. Patient was seen, chart reviewed and case discussed with Dr Lucianne Muss. Patient stated he uses methamphetamines everyday and has no intention of stopping. He stated "I take a pinch and put it in my coffee every morning." Patient stated he thinks he needs to be on Adderall. This provider educated him that he does not need to be on Adderall if he has a problem with stimulant use. Patient stated he [facility] a provider [facility] and they prescribe his medications for him. Per PDMP review patient received Oxycodone 5 mg #30 for 15 days on 05/02/21 from [facility] [address] in [place], [place]. Patient stated he is from [place], but has been staying with a friend in [place]. Patient stated he went to his Aunt's house the other day and his car would not start so he went inside to make a sandwich. He stated his aunt "went to hollerin and here I am." He denies being on a medication for Bipolar disorder but stated "you can put me on one." Reminded patient he has been receiving Zyprexa in the emergency room. Patient denies previous inpatient psych admissions. He stated he has been to rehab in 2016 or 2017 but does not want rehab at this time.   She is alert/oriented x  4; calm & cooperative during the assessment, however he has been repeatedly coming out of his room asking about another patient in the emergency room that he knows. Patient denied this to this provider.  His mood is calm and congruent with affect. He is speaking in a clear tone at moderate volume, and normal pace; with good eye contact.  Her thought process is tangential but coherent and relevant.  There is no indication that he is currently responding to internal/external stimuli or experiencing delusional thought content; and he has denied suicidal/self-harm/homicidal ideation, psychosis, and paranoia.  Patient is psychiatrically clear. Medication sent to the pharmacy on record. Outpatient resources placed in patient's AVS.   . Past Psychiatric History: Bipolar disorder (per note in chart), Polysubstance abuse  Risk to Self:  No Risk to Others:  No Prior Inpatient Therapy:  No Prior Outpatient Therapy:  Yes per patient  Past Medical History:  Past Medical History:  Diagnosis Date   Anxiety    Depression    GERD (gastroesophageal reflux disease)    Herpes     Past Surgical History:  Procedure Laterality Date   SHOULDER SURGERY Right 2001   TONSILLECTOMY Bilateral    WISDOM TOOTH EXTRACTION Bilateral    Family History:  Family History  Problem Relation Age of Onset   Asthma Mother    Cancer Father        lung cancer   Hypertension Father    Asthma Daughter    Family Psychiatric  History: unknown Social History:  Social History   Substance and Sexual Activity  Alcohol Use  Yes   Alcohol/week: 21.0 standard drinks   Types: 21 Cans of beer per week   Comment: occ     Social History   Substance and Sexual Activity  Drug Use Yes   Types: Oxycodone, Hydrocodone, Heroin, Nitrous oxide   Comment:  pt denies use 02-08-18    Social History   Socioeconomic History   Marital status: Single    Spouse name: Not on file   Number of children: Not on file   Years of education:  Not on file   Highest education level: Not on file  Occupational History   Not on file  Tobacco Use   Smoking status: Every Day    Packs/day: 0.50    Years: 36.00    Pack years: 18.00    Types: Cigarettes   Smokeless tobacco: Never  Vaping Use   Vaping Use: Never used  Substance and Sexual Activity   Alcohol use: Yes    Alcohol/week: 21.0 standard drinks    Types: 21 Cans of beer per week    Comment: occ   Drug use: Yes    Types: Oxycodone, Hydrocodone, Heroin, Nitrous oxide    Comment:  pt denies use 02-08-18   Sexual activity: Not on file  Other Topics Concern   Not on file  Social History Narrative   Not on file   Social Determinants of Health   Financial Resource Strain: Not on file  Food Insecurity: Not on file  Transportation Needs: Not on file  Physical Activity: Not on file  Stress: Not on file  Social Connections: Not on file   Additional Social History:    Allergies:  No Known Allergies  Labs: No results found for this or any previous visit (from the past 48 hour(s)).  Medications:  Current Facility-Administered Medications  Medication Dose Route Frequency Provider Last Rate Last Admin   acetaminophen (TYLENOL) tablet 650 mg  650 mg Oral [id number] PRN Delora Fuel, MD   [id number] mg at 09/28/21 0304   alum & mag hydroxide-simeth (MAALOX/MYLANTA) 200-200-20 MG/5ML suspension 30 mL  30 mL Oral [id number] PRN Delora Fuel, MD       aspirin EC tablet 325 mg  [id number] mg Oral Daily Delora Fuel, MD   [id number] mg at 09/28/21 [id number]   diltiazem (CARDIZEM CD) 24 hr capsule 180 mg  180 mg Oral Daily Wynona Dove A, DO   180 mg at [id number]   folic acid (FOLVITE) tablet 1 mg  1 mg Oral Daily Wynona Dove A, DO   1 mg at 09/28/21 [id number]   haloperidol (HALDOL) tablet 5 mg  5 mg Oral [id number] PRN Delora Fuel, MD   5 mg at 09/28/21 1553   LORazepam (ATIVAN) tablet 1-4 mg  1-4 mg Oral Q1H PRN Wynona Dove A, DO   1 mg at 09/27/21 1438   Or   LORazepam (ATIVAN) injection 1-4 mg  1-4 mg Intravenous Q1H  PRN Wynona Dove A, DO       multivitamin with minerals tablet 1 tablet  1 tablet Oral Daily Wynona Dove A, DO   1 tablet at 09/28/21 [id number]   nicotine (NICODERM CQ - dosed in mg/24 hours) patch 14 mg  14 mg Transdermal Daily Delora Fuel, MD   14 mg at 09/28/21 0833   OLANZapine zydis (ZYPREXA) disintegrating tablet 10 mg  10 mg Oral Daily Leevy-Johnson, Brooke A, NP   10 mg at 09/28/21 0832   ondansetron (ZOFRAN) tablet 4 mg  4 mg Oral Q8H  PRN Dione Booze, MD   4 mg at 09/27/21 0503   pantoprazole (PROTONIX) EC tablet 40 mg  40 mg Oral Daily Dione Booze, MD   40 mg at 09/28/21 6468   thiamine tablet 100 mg  100 mg Oral Daily Tanda Rockers A, DO   100 mg at 09/27/21 1021   Or   thiamine (B-1) injection 100 mg  100 mg Intravenous Daily Sloan Leiter, DO       Current Outpatient Medications  Medication Sig Dispense Refill   acyclovir (ZOVIRAX) 400 MG tablet Take 1 tablet (400 mg total) by mouth 3 (three) times daily. 21 tablet 1   aspirin EC 325 MG tablet Take 1 tablet (325 mg total) by mouth daily. 30 tablet 0   cetirizine (ZYRTEC ALLERGY) 10 MG tablet Take 1 tablet (10 mg total) by mouth daily. 30 tablet 0   ibuprofen (ADVIL,MOTRIN) 600 MG tablet Take 1 tablet (600 mg total) by mouth every 6 (six) hours as needed. 30 tablet 0   omeprazole (PRILOSEC) 20 MG capsule Take 1 capsule (20 mg total) by mouth daily. 30 capsule 1   oxyCODONE-acetaminophen (PERCOCET/ROXICET) 5-325 MG tablet Take 1 tablet by mouth. Every 4-6 hours as needed      Musculoskeletal: Strength & Muscle Tone: within normal limits Gait & Station: normal Patient leans: N/A  Psychiatric Specialty Exam:  Presentation  General Appearance: Appropriate for Environment; Casual  Eye Contact:Good  Speech:Clear and Coherent; Normal Rate  Speech Volume:Normal  Handedness:Right   Mood and Affect  Mood:Euthymic  Affect:Appropriate  Thought Process  Thought Processes:Coherent  Descriptions of  Associations:Tangential  Orientation:Full (Time, Place and Person)  Thought Content:Logical  History of Schizophrenia/Schizoaffective disorder:No data recorded Duration of Psychotic Symptoms:No data recorded Hallucinations:Hallucinations: None (patient denies)  Ideas of Reference:None (patient denies)  Suicidal Thoughts:Suicidal Thoughts: No (patient denies)  Homicidal Thoughts:Homicidal Thoughts: No (patient denies)  Sensorium  Memory:Immediate Fair; Recent Fair; Remote Fair  Judgment:Poor  Insight:Shallow  Executive Functions  Concentration:Fair  Attention Span:Fair  Recall:Fair  Fund of Knowledge:Fair  Language:Fair   Psychomotor Activity  Psychomotor Activity:Psychomotor Activity: Normal  Assets  Assets:Communication Skills; Financial Resources/Insurance; Resilience; Social Support  Sleep  Sleep:Sleep: Fair  Physical Exam: Physical Exam Constitutional:      Appearance: Normal appearance.  HENT:     Head: Normocephalic.  Pulmonary:     Effort: Pulmonary effort is normal.  Musculoskeletal:        General: Normal range of motion.     Cervical back: Normal range of motion.  Neurological:     General: No focal deficit present.     Mental Status: He is alert and oriented to person, place, and time.  Psychiatric:        Attention and Perception: Attention normal. He does not perceive auditory or visual hallucinations.        Mood and Affect: Mood and affect normal.        Speech: Speech normal.        Behavior: Behavior normal. Behavior is cooperative.        Thought Content: Thought content normal. Thought content is not paranoid or delusional. Thought content does not include homicidal or suicidal ideation. Thought content does not include homicidal or suicidal plan.        Cognition and Memory: Cognition normal.   Review of Systems  Constitutional: Negative.  Negative for fever.  HENT: Negative.  Negative for congestion and sore throat.    Respiratory: Negative.  Negative for cough and  shortness of breath.   Cardiovascular: Negative.  Negative for chest pain.  Genitourinary: Negative.   Neurological: Negative.   Psychiatric/Behavioral:  Positive for substance abuse.    Blood pressure (!) 131/99, pulse 93, temperature 98.3 F (36.8 C), resp. rate 16, height 5\' 7"  (1.702 m), weight 71.3 kg, SpO2 100 %. Body mass index is 24.62 kg/m.  Treatment Plan Summary: Daily contact with patient to assess and evaluate symptoms and progress in treatment and Medication management  Patient does not meet criteria for inpatient psychiatric admission. Zyprexa prescription electronically sent to [place], [place], patient's preference. Outpatient resources for therapy, medication management and substance abuse were placed in patient AVS.   Disposition: No evidence of imminent risk to self or others at present.   Patient does not meet criteria for psychiatric inpatient admission. Supportive therapy provided about ongoing stressors. Discussed crisis plan, support from social network, calling 911, coming to the Emergency Department, and calling Suicide Hotline.  This service was provided via telemedicine using a 2-way, interactive audio and video technology.  Names of all persons participating in this telemedicine service and their role in this encounter. Name: Oz Tavani Role: Patient  Name: Jinny Blossom Role: PMHNP-BC  Name: Hampton Abbot  Role: Attending MD  Name:  Role:     Ethelene Hal, NP 09/28/2021 6:07 PM

## 2021-09-28 NOTE — Discharge Instructions (Signed)
Follow-up as instructed by behavioral ° °

## 2021-09-28 NOTE — ED Notes (Signed)
Pt lunch at bedside 

## 2021-09-28 NOTE — ED Notes (Signed)
Pt ambulatory to bathroom without assistance. Pt denies complaints at this time.

## 2021-12-30 ENCOUNTER — Encounter (HOSPITAL_COMMUNITY): Payer: Self-pay | Admitting: Emergency Medicine

## 2021-12-30 ENCOUNTER — Emergency Department (HOSPITAL_COMMUNITY)
Admission: EM | Admit: 2021-12-30 | Discharge: 2022-01-01 | Disposition: A | Discharge status: Other Acute Inpatient Hospital | Payer: Medicaid Other | Attending: Emergency Medicine | Admitting: Emergency Medicine

## 2021-12-30 DIAGNOSIS — Z20822 Contact with and (suspected) exposure to covid-19: Secondary | ICD-10-CM | POA: Diagnosis not present

## 2021-12-30 DIAGNOSIS — F319 Bipolar disorder, unspecified: Secondary | ICD-10-CM | POA: Insufficient documentation

## 2021-12-30 DIAGNOSIS — F1910 Other psychoactive substance abuse, uncomplicated: Secondary | ICD-10-CM | POA: Diagnosis not present

## 2021-12-30 DIAGNOSIS — Z79899 Other long term (current) drug therapy: Secondary | ICD-10-CM | POA: Insufficient documentation

## 2021-12-30 DIAGNOSIS — X58XXXA Exposure to other specified factors, initial encounter: Secondary | ICD-10-CM | POA: Diagnosis not present

## 2021-12-30 DIAGNOSIS — F1999 Other psychoactive substance use, unspecified with unspecified psychoactive substance-induced disorder: Secondary | ICD-10-CM | POA: Diagnosis present

## 2021-12-30 DIAGNOSIS — Y900 Blood alcohol level of less than 20 mg/100 ml: Secondary | ICD-10-CM | POA: Diagnosis not present

## 2021-12-30 DIAGNOSIS — F1594 Other stimulant use, unspecified with stimulant-induced mood disorder: Secondary | ICD-10-CM

## 2021-12-30 DIAGNOSIS — F1514 Other stimulant abuse with stimulant-induced mood disorder: Secondary | ICD-10-CM | POA: Diagnosis not present

## 2021-12-30 DIAGNOSIS — F29 Unspecified psychosis not due to a substance or known physiological condition: Secondary | ICD-10-CM | POA: Insufficient documentation

## 2021-12-30 DIAGNOSIS — Z7982 Long term (current) use of aspirin: Secondary | ICD-10-CM | POA: Insufficient documentation

## 2021-12-30 DIAGNOSIS — D72829 Elevated white blood cell count, unspecified: Secondary | ICD-10-CM | POA: Diagnosis not present

## 2021-12-30 DIAGNOSIS — S99922A Unspecified injury of left foot, initial encounter: Secondary | ICD-10-CM | POA: Diagnosis present

## 2021-12-30 DIAGNOSIS — S91302A Unspecified open wound, left foot, initial encounter: Secondary | ICD-10-CM | POA: Diagnosis not present

## 2021-12-30 DIAGNOSIS — F209 Schizophrenia, unspecified: Secondary | ICD-10-CM | POA: Insufficient documentation

## 2021-12-30 LAB — ETHANOL: Alcohol, Ethyl (B): <10 mg/dL (ref <10)

## 2021-12-30 LAB — COMPREHENSIVE METABOLIC PANEL
ALT: 25 U/L (ref 0–44)
AST: 27 U/L (ref 15–41)
Albumin: 3.8 g/dL (ref 3.5–5.0)
Alkaline Phosphatase: 87 U/L (ref 38–126)
Anion gap: 4 — ABNORMAL LOW (ref 5–15)
BUN: 16 mg/dL (ref 6–20)
CO2: 27 mmol/L (ref 22–32)
Calcium: 8.9 mg/dL (ref 8.9–10.3)
Chloride: 102 mmol/L (ref 98–111)
Creatinine, Ser: 0.76 mg/dL (ref 0.61–1.24)
GFR, Estimated: >60 mL/min (ref >60)
Glucose, Bld: 102 mg/dL — ABNORMAL HIGH (ref 70–99)
Potassium: 3.9 mmol/L (ref 3.5–5.1)
Sodium: 133 mmol/L — ABNORMAL LOW (ref 135–145)
Total Bilirubin: 0.5 mg/dL (ref 0.3–1.2)
Total Protein: 7.5 g/dL (ref 6.5–8.1)

## 2021-12-30 LAB — RAPID URINE DRUG SCREEN, HOSP PERFORMED
Amphetamines: POSITIVE — AB
Barbiturates: NOT DETECTED
Benzodiazepines: NOT DETECTED
Cocaine: NOT DETECTED
Opiates: NOT DETECTED
Tetrahydrocannabinol: NOT DETECTED

## 2021-12-30 LAB — CBC
HCT: 40.1 % (ref 39.0–52.0)
Hemoglobin: 13.3 g/dL (ref 13.0–17.0)
MCH: 30.5 pg (ref 26.0–34.0)
MCHC: 33.2 g/dL (ref 30.0–36.0)
MCV: 92 fL (ref 80.0–100.0)
Platelets: 261 10*3/uL (ref 150–400)
RBC: 4.36 MIL/uL (ref 4.22–5.81)
RDW: 13.9 % (ref 11.5–15.5)
WBC: 14.6 10*3/uL — ABNORMAL HIGH (ref 4.0–10.5)
nRBC: 0 % (ref 0.0–0.2)

## 2021-12-30 LAB — SALICYLATE LEVEL: Salicylate Lvl: <7 mg/dL — ABNORMAL LOW (ref 7.0–30.0)

## 2021-12-30 LAB — ACETAMINOPHEN LEVEL: Acetaminophen (Tylenol), Serum: <10 ug/mL — ABNORMAL LOW (ref 10–30)

## 2021-12-30 MED ORDER — PANTOPRAZOLE SODIUM 40 MG PO TBEC
40.0000 mg | DELAYED_RELEASE_TABLET | Freq: Every day | ORAL | Status: DC
  Administered 2021-12-31 – 2022-01-01 (×2): 40 mg via ORAL
  Filled 2021-12-30 (×2): qty 1

## 2021-12-30 MED ORDER — OLANZAPINE 5 MG PO TBDP
10.0000 mg | ORAL_TABLET | Freq: Every day | ORAL | Status: DC
  Administered 2021-12-31 – 2022-01-01 (×2): 10 mg via ORAL
  Filled 2021-12-30 (×2): qty 2

## 2021-12-30 NOTE — ED Triage Notes (Signed)
Pt brought in by [facility] after family took out IVC papers. Pt has hx of schizophrenia and has not been taking his medications. Per papers pt has been walking out in traffic and acting erratically.

## 2021-12-30 NOTE — ED Notes (Signed)
Pt given sandwich and chips

## 2021-12-31 DIAGNOSIS — F1594 Other stimulant use, unspecified with stimulant-induced mood disorder: Secondary | ICD-10-CM

## 2021-12-31 DIAGNOSIS — F310 Bipolar disorder, current episode hypomanic: Secondary | ICD-10-CM

## 2021-12-31 MED ORDER — ZIPRASIDONE MESYLATE 20 MG IM SOLR
10.0000 mg | Freq: Once | INTRAMUSCULAR | Status: AC
  Administered 2021-12-31: 10 mg via INTRAMUSCULAR
  Filled 2021-12-31: qty 20

## 2021-12-31 MED ORDER — NICOTINE 14 MG/24HR TD PT24
14.0000 mg | MEDICATED_PATCH | Freq: Every day | TRANSDERMAL | Status: DC
  Administered 2021-12-31 – 2022-01-01 (×2): 14 mg via TRANSDERMAL
  Filled 2021-12-31 (×2): qty 1

## 2021-12-31 MED ORDER — IBUPROFEN 400 MG PO TABS
600.0000 mg | ORAL_TABLET | Freq: Three times a day (TID) | ORAL | Status: DC | PRN
  Administered 2021-12-31: 600 mg via ORAL
  Filled 2021-12-31: qty 2

## 2021-12-31 MED ORDER — HYDROCORTISONE 1 % EX CREA
1.0000 "application " | TOPICAL_CREAM | Freq: Every day | CUTANEOUS | Status: DC
  Administered 2021-12-31 – 2022-01-01 (×2): 1 via TOPICAL
  Filled 2021-12-31: qty 28

## 2021-12-31 MED ORDER — ACETAMINOPHEN 325 MG PO TABS
650.0000 mg | ORAL_TABLET | Freq: Four times a day (QID) | ORAL | Status: DC | PRN
  Administered 2021-12-31 – 2022-01-01 (×2): 650 mg via ORAL
  Filled 2021-12-31 (×2): qty 2

## 2021-12-31 MED ORDER — STERILE WATER FOR INJECTION IJ SOLN
INTRAMUSCULAR | Status: AC
  Administered 2021-12-31: 1.2 mL via INTRAMUSCULAR
  Filled 2021-12-31: qty 10

## 2021-12-31 NOTE — ED Notes (Signed)
Pt anxious, pacing in room.  Having auditory hallucinations.  Pt having "tic like" motions.  Talking very fast and difficult to understand.  Informed md.  New orders for geodon given

## 2021-12-31 NOTE — ED Notes (Signed)
Cart in room for TTS

## 2021-12-31 NOTE — ED Notes (Signed)
Ambulatory to restroom

## 2021-12-31 NOTE — ED Provider Notes (Signed)
Patient medically cleared awaiting placement by behavioral health.   Vanetta Mulders, MD 12/31/21 2112
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
## 2021-12-31 NOTE — ED Notes (Signed)
Tts completed

## 2021-12-31 NOTE — ED Provider Notes (Signed)
[facility] EMERGENCY DEPARTMENT Provider Note   CSN: [id number] Arrival date & time: 12/30/21  1921     History  Chief Complaint  Patient presents with   IVC    Carlos Johnson is a 61 y.o. male.  IVC paperwork completed.  Patient brought in by [facility] after family took out IVC papers.  Patient has history of schizophrenia not taking his medications.  Patient's been acting Johnson.  Walking on them traffic.  Patient directly denies any suicidal ideation.  But certainly does not seem to be thinking clearly.  Patient has history of gastroesophageal reflux disease has a history of schizophrenia.  Patient seen February 17 for bipolar disorder psychotic features.  Patient seen January 2023 for polysubstance abuse and bipolar disease.      Home Medications Prior to Admission medications   Medication Sig Start Date End Date Taking? Authorizing Provider  acyclovir (ZOVIRAX) 400 MG tablet Take 1 tablet (400 mg total) by mouth 3 (three) times daily. 09/01/18   Jacalyn Lefevre, MD  aspirin EC 325 MG tablet Take 1 tablet (325 mg total) by mouth daily. 04/18/21   Jacalyn Lefevre, MD  cetirizine (ZYRTEC ALLERGY) 10 MG tablet Take 1 tablet (10 mg total) by mouth daily. 09/01/18   Jacalyn Lefevre, MD  ibuprofen (ADVIL,MOTRIN) 600 MG tablet Take 1 tablet (600 mg total) by mouth every 6 (six) hours as needed. 09/01/18   Jacalyn Lefevre, MD  OLANZapine zydis (ZYPREXA) 10 MG disintegrating tablet Take 1 tablet (10 mg total) by mouth daily. 09/29/21   Laveda Abbe, NP  omeprazole (PRILOSEC) 20 MG capsule Take 1 capsule (20 mg total) by mouth daily. 09/01/18   Jacalyn Lefevre, MD  oxyCODONE-acetaminophen (PERCOCET/ROXICET) 5-325 MG tablet Take 1 tablet by mouth. Every 4-6 hours as needed    [provider]      Allergies    Patient has no known allergies.    Review of Systems   Review of Systems  Unable to perform ROS: Psychiatric disorder   Physical Exam Updated Vital Signs BP  133/79 (BP Location: Right Arm)   Pulse 93   Temp 98 F (36.7 C) (Oral)   Resp 16   Ht 1.702 m (5\' 7" )   Wt 71.3 kg   SpO2 97%   BMI 24.62 kg/m  Physical Exam Vitals and nursing note reviewed.  Constitutional:      General: He is not in acute distress.    Appearance: Normal appearance. He is well-developed.  HENT:     Head: Normocephalic and atraumatic.  Eyes:     Extraocular Movements: Extraocular movements intact.     Conjunctiva/sclera: Conjunctivae normal.     Pupils: Pupils are equal, round, and reactive to light.  Cardiovascular:     Rate and Rhythm: Normal rate and regular rhythm.     Heart sounds: No murmur heard. Pulmonary:     Effort: Pulmonary effort is normal. No respiratory distress.     Breath sounds: Normal breath sounds.  Abdominal:     Palpations: Abdomen is soft.     Tenderness: There is no abdominal tenderness.  Musculoskeletal:        General: No swelling.     Cervical back: Neck supple.     Comments: Patient's left heel has a wound that is healing.  Appears to be several days old no signs of infection.  Has some calluses on his heel.  Skin:    General: Skin is warm and dry.     Capillary  Refill: Capillary refill takes less than 2 seconds.  Neurological:     Mental Status: He is alert.     Comments: Patient alert will follow commands.  But not making sense with his talking.  Does deny Seidel ideation.  All extremities moving.  No gross neurological deficit.  Psychiatric:        Mood and Affect: Mood normal.    ED Results / Procedures / Treatments   Labs (all labs ordered are listed, but only abnormal results are displayed) Labs Reviewed  COMPREHENSIVE METABOLIC PANEL - Abnormal; Notable for the following components:      Result Value   Sodium 133 (*)    Glucose, Bld 102 (*)    Anion gap 4 (*)    All other components within normal limits  SALICYLATE LEVEL - Abnormal; Notable for the following components:   Salicylate Lvl <7.0 (*)    All other  components within normal limits  ACETAMINOPHEN LEVEL - Abnormal; Notable for the following components:   Acetaminophen (Tylenol), Serum <10 (*)    All other components within normal limits  CBC - Abnormal; Notable for the following components:   WBC 14.6 (*)    All other components within normal limits  RAPID URINE DRUG SCREEN, HOSP PERFORMED - Abnormal; Notable for the following components:   Amphetamines POSITIVE (*)    All other components within normal limits  ETHANOL    EKG None  Radiology No results found.  Procedures Procedures    Medications Ordered in ED Medications  OLANZapine zydis (ZYPREXA) disintegrating tablet 10 mg (has no administration in time range)  pantoprazole (PROTONIX) EC tablet 40 mg (has no administration in time range)    ED Course/ Medical Decision Making/ A&P                           Medical Decision Making Amount and/or Complexity of Data Reviewed Labs: ordered.  Risk Prescription drug management.   Patient drug screen positive for amphetamines.  Complete metabolic panel without any significant abnormalities liver function tests are normal.  Salicylate level not elevated Tylenol level not elevated CBC had a white count of 14.6 hemoglobin 13.3.  This white count may be elevated secondary to the amphetamines.  However will get a urinalysis which is pending.  Patient medically cleared for evaluation by behavioral health.  Patient's IVC paperwork completed.  Patient's home meds renewed.  Final Clinical Impression(s) / ED Diagnoses Final diagnoses:  Psychosis, unspecified psychosis type [facility])    Rx / DC Orders ED Discharge Orders     None         Vanetta Mulders, MD 12/31/21 0004

## 2022-01-01 ENCOUNTER — Other Ambulatory Visit: Payer: Self-pay | Admitting: Psychiatry

## 2022-01-01 ENCOUNTER — Inpatient Hospital Stay (HOSPITAL_COMMUNITY)
Admission: AD | Admit: 2022-01-01 | Discharge: 2022-01-08 | DRG: 885 | Disposition: A | Discharge status: Home / Self Care | Payer: Medicaid Other | Source: Intra-hospital | Attending: Emergency Medicine | Admitting: Emergency Medicine

## 2022-01-01 DIAGNOSIS — Z91199 Patient's noncompliance with other medical treatment and regimen due to unspecified reason: Secondary | ICD-10-CM | POA: Diagnosis not present

## 2022-01-01 DIAGNOSIS — M549 Dorsalgia, unspecified: Secondary | ICD-10-CM | POA: Diagnosis present

## 2022-01-01 DIAGNOSIS — Z8249 Family history of ischemic heart disease and other diseases of the circulatory system: Secondary | ICD-10-CM

## 2022-01-01 DIAGNOSIS — K219 Gastro-esophageal reflux disease without esophagitis: Secondary | ICD-10-CM | POA: Diagnosis present

## 2022-01-01 DIAGNOSIS — Z20822 Contact with and (suspected) exposure to covid-19: Secondary | ICD-10-CM | POA: Diagnosis present

## 2022-01-01 DIAGNOSIS — F17210 Nicotine dependence, cigarettes, uncomplicated: Secondary | ICD-10-CM | POA: Diagnosis present

## 2022-01-01 DIAGNOSIS — L304 Erythema intertrigo: Secondary | ICD-10-CM | POA: Diagnosis present

## 2022-01-01 DIAGNOSIS — F419 Anxiety disorder, unspecified: Secondary | ICD-10-CM | POA: Diagnosis present

## 2022-01-01 DIAGNOSIS — G894 Chronic pain syndrome: Secondary | ICD-10-CM | POA: Diagnosis present

## 2022-01-01 DIAGNOSIS — A6002 Herpesviral infection of other male genital organs: Secondary | ICD-10-CM | POA: Diagnosis present

## 2022-01-01 DIAGNOSIS — Z8673 Personal history of transient ischemic attack (TIA), and cerebral infarction without residual deficits: Secondary | ICD-10-CM | POA: Diagnosis not present

## 2022-01-01 DIAGNOSIS — F29 Unspecified psychosis not due to a substance or known physiological condition: Secondary | ICD-10-CM | POA: Diagnosis present

## 2022-01-01 DIAGNOSIS — G4700 Insomnia, unspecified: Secondary | ICD-10-CM | POA: Diagnosis present

## 2022-01-01 DIAGNOSIS — R0981 Nasal congestion: Secondary | ICD-10-CM | POA: Diagnosis present

## 2022-01-01 DIAGNOSIS — Z5900 Homelessness unspecified: Secondary | ICD-10-CM | POA: Diagnosis not present

## 2022-01-01 DIAGNOSIS — I10 Essential (primary) hypertension: Secondary | ICD-10-CM

## 2022-01-01 DIAGNOSIS — Z7982 Long term (current) use of aspirin: Secondary | ICD-10-CM

## 2022-01-01 DIAGNOSIS — F1510 Other stimulant abuse, uncomplicated: Secondary | ICD-10-CM | POA: Diagnosis present

## 2022-01-01 DIAGNOSIS — F23 Brief psychotic disorder: Principal | ICD-10-CM | POA: Diagnosis present

## 2022-01-01 DIAGNOSIS — F310 Bipolar disorder, current episode hypomanic: Secondary | ICD-10-CM | POA: Diagnosis not present

## 2022-01-01 DIAGNOSIS — T148XXA Other injury of unspecified body region, initial encounter: Secondary | ICD-10-CM | POA: Diagnosis present

## 2022-01-01 LAB — URINALYSIS, ROUTINE W REFLEX MICROSCOPIC
Bilirubin Urine: NEGATIVE
Glucose, UA: NEGATIVE mg/dL
Hgb urine dipstick: NEGATIVE
Ketones, ur: NEGATIVE mg/dL
Leukocytes,Ua: NEGATIVE
Nitrite: NEGATIVE
Protein, ur: NEGATIVE mg/dL
Specific Gravity, Urine: 1.016 (ref 1.005–1.030)
pH: 8 (ref 5.0–8.0)

## 2022-01-01 LAB — SARS CORONAVIRUS 2 BY RT PCR: SARS Coronavirus 2 by RT PCR: NEGATIVE

## 2022-01-01 LAB — LYME DISEASE SEROLOGY W/REFLEX: Lyme Total Antibody EIA: NEGATIVE

## 2022-01-01 NOTE — Progress Notes (Signed)
Pt was accepted to [facility] 01/01/22 PENDING Negative COVID-19 and EKG Bed Assignment [address].  Pt meets inpatient criteria per Mellissa Kohut, NP  Attending is Mason Jim MD  Report can be called to: -Adult unit: [phone]  Pt can arrive after 2100  Care Team notified: Mellissa Kohut, NP, [facility] Rona Ravens, RN, and Clotilde Dieter, NP.  Kelton Pillar, LCSWA 01/01/2022 @ 1:10 PM

## 2022-01-01 NOTE — ED Provider Notes (Signed)
Emergency Medicine Observation Re-evaluation Note  Carlos Johnson is a 61 y.o. male, seen on rounds today.  Pt initially presented to the ED for complaints of IVC Currently, the patient is sleeping.  Physical Exam  BP (!) 135/115   Pulse 87   Temp 98.6 F (37 C)   Resp 17   Ht 5\' 7"  (1.702 m)   Wt 71.3 kg   SpO2 99%   BMI 24.62 kg/m  Physical Exam General: No acute distress Cardiac: Regular rate Lungs: No respiratory distress Psych: Currently calm  ED Course / MDM  EKG:   I have reviewed the labs performed to date as well as medications administered while in observation.  Recent changes in the last 24 hours include no new changes.  Patient is awaiting inpatient psychiatry care admission for stabilization.  Plan  Current plan is for patient to board until placed. He is medically cleared for psychiatric evaluation.  Labs reviewed.  Nonspecific elevated white count and sodium was slightly low at 133.  No fevers. Seems like RMSF and Lyme disease serology along with a UA are still pending.  Tobyn Calden Toups is under involuntary commitment.      Varney Biles, MD 01/01/22 [phone]

## 2022-01-01 NOTE — ED Notes (Signed)
Report called to [facility] with [facility]. Pt to be transported by [facility]

## 2022-01-01 NOTE — ED Notes (Signed)
Pt has been accepted to [facility] rm 401-1 per Lynnda Shields. Pt can arrive after 2100 tonight 01/01/22.

## 2022-01-02 ENCOUNTER — Encounter (HOSPITAL_COMMUNITY): Payer: Self-pay | Admitting: Psychiatry

## 2022-01-02 ENCOUNTER — Other Ambulatory Visit: Payer: Self-pay

## 2022-01-02 DIAGNOSIS — F23 Brief psychotic disorder: Secondary | ICD-10-CM

## 2022-01-02 DIAGNOSIS — F29 Unspecified psychosis not due to a substance or known physiological condition: Principal | ICD-10-CM

## 2022-01-02 MED ORDER — OLANZAPINE 10 MG PO TBDP
10.0000 mg | ORAL_TABLET | Freq: Every day | ORAL | Status: DC
Start: 2022-01-03 — End: 2022-01-08
  Administered 2022-01-03 – 2022-01-07 (×5): 10 mg via ORAL
  Filled 2022-01-02 (×8): qty 1

## 2022-01-02 MED ORDER — TRAZODONE HCL 50 MG PO TABS
50.0000 mg | ORAL_TABLET | Freq: Every evening | ORAL | Status: DC | PRN
  Administered 2022-01-06 – 2022-01-07 (×2): 50 mg via ORAL
  Filled 2022-01-02 (×2): qty 1

## 2022-01-02 MED ORDER — PANTOPRAZOLE SODIUM 40 MG PO TBEC
40.0000 mg | DELAYED_RELEASE_TABLET | Freq: Every day | ORAL | Status: DC
Start: 2022-01-02 — End: 2022-01-08
  Administered 2022-01-02 – 2022-01-08 (×7): 40 mg via ORAL
  Filled 2022-01-02 (×9): qty 1

## 2022-01-02 MED ORDER — LORAZEPAM 1 MG PO TABS: 1.0000 mg | ORAL_TABLET | ORAL | Status: DC | PRN

## 2022-01-02 MED ORDER — LIDOCAINE 5 % EX PTCH
1.0000 | MEDICATED_PATCH | CUTANEOUS | Status: DC
  Administered 2022-01-02 – 2022-01-07 (×6): 1 via TRANSDERMAL
  Filled 2022-01-02 (×9): qty 1

## 2022-01-02 MED ORDER — CLONIDINE HCL 0.1 MG PO TABS: 0.1000 mg | ORAL_TABLET | Freq: Three times a day (TID) | ORAL | Status: DC | PRN

## 2022-01-02 MED ORDER — HYDROXYZINE HCL 25 MG PO TABS: 25.0000 mg | ORAL_TABLET | Freq: Four times a day (QID) | ORAL | Status: DC | PRN

## 2022-01-02 MED ORDER — VALACYCLOVIR HCL 500 MG PO TABS
1000.0000 mg | ORAL_TABLET | Freq: Every day | ORAL | Status: AC
  Administered 2022-01-03 – 2022-01-07 (×5): 1000 mg via ORAL
  Filled 2022-01-02 (×6): qty 2

## 2022-01-02 MED ORDER — VALACYCLOVIR HCL 500 MG PO TABS
500.0000 mg | ORAL_TABLET | Freq: Every day | ORAL | Status: DC
  Administered 2022-01-02: 500 mg via ORAL
  Filled 2022-01-02 (×4): qty 1

## 2022-01-02 MED ORDER — ZIPRASIDONE MESYLATE 20 MG IM SOLR: 20.0000 mg | INTRAMUSCULAR | Status: DC | PRN

## 2022-01-02 MED ORDER — FUROSEMIDE 20 MG PO TABS
20.0000 mg | ORAL_TABLET | Freq: Every day | ORAL | Status: DC
  Administered 2022-01-02 – 2022-01-03 (×2): 20 mg via ORAL
  Filled 2022-01-02 (×5): qty 1

## 2022-01-02 MED ORDER — OLANZAPINE 10 MG PO TBDP
10.0000 mg | ORAL_TABLET | Freq: Every day | ORAL | Status: DC
Start: 2022-01-02 — End: 2022-01-02
  Administered 2022-01-02: 10 mg via ORAL
  Filled 2022-01-02 (×4): qty 1

## 2022-01-02 MED ORDER — DILTIAZEM HCL ER 180 MG PO CP24
180.0000 mg | ORAL_CAPSULE | Freq: Every day | ORAL | Status: DC
Start: 2022-01-02 — End: 2022-01-08
  Administered 2022-01-02 – 2022-01-08 (×7): 180 mg via ORAL
  Filled 2022-01-02 (×9): qty 1

## 2022-01-02 MED ORDER — BENZTROPINE MESYLATE 0.5 MG PO TABS
0.5000 mg | ORAL_TABLET | Freq: Two times a day (BID) | ORAL | Status: DC | PRN
Start: 2022-01-02 — End: 2022-01-08

## 2022-01-02 MED ORDER — OLANZAPINE 5 MG PO TBDP: 5.0000 mg | ORAL_TABLET | Freq: Three times a day (TID) | ORAL | Status: DC | PRN

## 2022-01-02 NOTE — Progress Notes (Signed)
Patient is a 61 year old male admitted involuntarily after non compliance of medication. Patient IVC 'ed by sister after her witnessing his erratic behavior mumbling to himself and rocking back and forth. It was reported that he was walking on highway and was concerned for his safety. Patient reported during admission that he has had several mini strokes and has been having blurred vision since his stroke last year. Skin assessment completed and documented. He reports that the various red spots  seen on his skin are deer tick bites. He was pleasant and cooperative, sandwich tray and milk were provided for patient. Oriented to unit and 15  checks for safety started.

## 2022-01-02 NOTE — Progress Notes (Signed)
Patient had elevated blood pressure upon admission. After patient was on the unit for a while blood pressure was rechecked to see if it had decreased any. Blood pressure remained elevated. NP Roselyn Bering was notified of situation. No order given for medication to be given, home medications were restarted for am.

## 2022-01-02 NOTE — BHH Group Notes (Signed)
.  Psychoeducational Group Note    Date:  5/27//23 Time: 1300-1400    Purpose of Group: . The group focus' on teaching patients on how to identify their needs and their Life Skills:  A group where two lists are made. What people need and what are things that we do that are unhealthy. The lists are developed by the patients and it is explained that we often do the actions that are not healthy to get our list of needs met.  Goal:: to develop the coping skills needed to get their needs met  Participation Level:  did not attend  Paulino Rily
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
## 2022-01-02 NOTE — Group Note (Unsigned)
Date:  01/02/2022 Time:  11:11 AM  Group Topic/Focus:  Orientation:   The focus of this group is to educate the patient on the purpose and policies of crisis stabilization and provide a format to answer questions about their admission.  The group details unit policies and expectations of patients while admitted.     Participation Level:  {BHH PARTICIPATION OVFIE:33295}  Participation Quality:  {BHH PARTICIPATION QUALITY:22265}  Affect:  {BHH AFFECT:22266}  Cognitive:  {BHH COGNITIVE:22267}  Insight: {BHH Insight2:20797}  Engagement in Group:  {BHH ENGAGEMENT IN JOACZ:66063}  Modes of Intervention:  {BHH MODES OF INTERVENTION:22269}  Additional Comments:  ***  Carlos Johnson 01/02/2022, 11:11 AM

## 2022-01-02 NOTE — Progress Notes (Signed)
Adult Psychoeducational Group Note  Date:  01/02/2022 Time:  8:32 PM  Group Topic/Focus:  Wrap-Up Group:   The focus of this group is to help patients review their daily goal of treatment and discuss progress on daily workbooks.  Participation Level:  Active  Participation Quality:  Appropriate and Attentive  Affect:  Appropriate  Cognitive:  Alert and Appropriate  Insight: Appropriate  Engagement in Group:  Engaged  Modes of Intervention:  Discussion  Additional Comments:  Pt was slightly withdrawn during group discussion. Pt states that he had an "okay" day and rated it at a 6/10. Pt states that  he's been getting plenty of rest and has communicated with his care team, but has had an issue getting in contact with his friends and family. Pt denies everything.   Gerhard Perches 01/02/2022, 8:32 PM

## 2022-01-02 NOTE — Progress Notes (Signed)
   01/02/22 1100  Psych Admission Type (Psych Patients Only)  Admission Status Involuntary  Psychosocial Assessment  Patient Complaints None  Eye Contact Brief  Facial Expression Wide-eyed  Affect Flat  Speech Logical/coherent  Interaction Minimal  Motor Activity Slow  Appearance/Hygiene Disheveled  Behavior Characteristics Cooperative  Mood Pleasant  Thought Process  Coherency Flight of ideas  Content Preoccupation  Delusions None reported or observed  Perception WDL  Hallucination None reported or observed  Judgment Poor  Confusion None  Danger to Self  Current suicidal ideation? Denies  Danger to Others  Danger to Others None reported or observed
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
## 2022-01-02 NOTE — Tx Team (Signed)
Initial Treatment Plan 01/02/2022 12:59 AM [name] PYK:[id number]    PATIENT STRESSORS: Medication change or noncompliance     PATIENT STRENGTHS: Ability for insight  Supportive family/friends    PATIENT IDENTIFIED PROBLEMS: Medication non compliance  Homeless                   DISCHARGE CRITERIA:  Adequate post-discharge living arrangements  PRELIMINARY DISCHARGE PLAN: Placement in alternative living arrangements  PATIENT/FAMILY INVOLVEMENT: This treatment plan has been presented to and reviewed with the patient, Carlos Johnson, and/or family member.  The patient and family have been given the opportunity to ask questions and make suggestions.  Floyce Stakes, RN 01/02/2022, 12:59 AM

## 2022-01-02 NOTE — Group Note (Signed)
LCSW Group Therapy Note  Group Date: 01/02/2022 Start Time: 1000 End Time: 1100   Type of Therapy and Topic:  Group Therapy: Anger Cues and Responses  Participation Level:  Active   Description of Group:   In this group, patients learned how to recognize the physical, cognitive, emotional, and behavioral responses they have to anger-provoking situations.  They identified a recent time they became angry and how they reacted.  They analyzed how their reaction was possibly beneficial and how it was possibly unhelpful.  The group discussed a variety of healthier coping skills that could help with such a situation in the future.  Focus was placed on how helpful it is to recognize the underlying emotions to our anger, because working on those can lead to a more permanent solution as well as our ability to focus on the important rather than the urgent.  Therapeutic Goals: Patients will remember their last incident of anger and how they felt emotionally and physically, what their thoughts were at the time, and how they behaved. Patients will identify how their behavior at that time worked for them, as well as how it worked against them. Patients will explore possible new behaviors to use in future anger situations. Patients will learn that anger itself is normal and cannot be eliminated, and that healthier reactions can assist with resolving conflict rather than worsening situations.  Summary of Patient Progress:  Carlos Johnson was active during the group. He shared a recent occurrence wherein feeling tired when someone woke him up led to anger. He demonstrated fair insight into the subject matter, was respectful of peers, and participated throughout the entire session.  Therapeutic Modalities:   Cognitive Behavioral Therapy    Carlos Johnson, LCSWA 01/02/2022  1:03 PM

## 2022-01-02 NOTE — Group Note (Signed)
Date:  01/02/2022 Time:  11:13 AM  Group Topic/Focus:  Orientation:   The focus of this group is to educate the patient on the purpose and policies of crisis stabilization and provide a format to answer questions about their admission.  The group details unit policies and expectations of patients while admitted.    Participation Level:  Active  Participation Quality:  Appropriate  Affect:  Appropriate  Cognitive:  Appropriate  Insight: Appropriate  Engagement in Group:  Engaged  Modes of Intervention:  Discussion  Jerrye Beavers 01/02/2022, 11:13 AM

## 2022-01-02 NOTE — [facility] Group Notes (Signed)
Psychoeducational Group Note  Date: 01/02/2022 Time: 0900-1000    Goal Setting   Purpose of Group: This group helps to provide patients with the steps of setting a goal that is specific, measurable, attainable, realistic and time specific. A discussion on how we keep ourselves stuck with negative self talk. Homework given for Patients to write 30 positive attributes about themselves.    Participation Level:  did not attend  Teegan Guinther A 

## 2022-01-03 ENCOUNTER — Encounter (HOSPITAL_COMMUNITY): Payer: Self-pay | Admitting: Psychiatry

## 2022-01-03 DIAGNOSIS — F1510 Other stimulant abuse, uncomplicated: Secondary | ICD-10-CM | POA: Diagnosis present

## 2022-01-03 LAB — CBC WITH DIFFERENTIAL/PLATELET
Abs Immature Granulocytes: 0.02 10*3/uL (ref 0.00–0.07)
Basophils Absolute: 0.1 10*3/uL (ref 0.0–0.1)
Basophils Relative: 1 %
Eosinophils Absolute: 0.2 10*3/uL (ref 0.0–0.5)
Eosinophils Relative: 2 %
HCT: 44.7 % (ref 39.0–52.0)
Hemoglobin: 14.3 g/dL (ref 13.0–17.0)
Immature Granulocytes: 0 %
Lymphocytes Relative: 33 %
Lymphs Abs: 2.4 10*3/uL (ref 0.7–4.0)
MCH: 30.4 pg (ref 26.0–34.0)
MCHC: 32 g/dL (ref 30.0–36.0)
MCV: 95.1 fL (ref 80.0–100.0)
Monocytes Absolute: 0.7 10*3/uL (ref 0.1–1.0)
Monocytes Relative: 9 %
Neutro Abs: 4.1 10*3/uL (ref 1.7–7.7)
Neutrophils Relative %: 55 %
Platelets: 328 10*3/uL (ref 150–400)
RBC: 4.7 MIL/uL (ref 4.22–5.81)
RDW: 13.7 % (ref 11.5–15.5)
WBC: 7.4 10*3/uL (ref 4.0–10.5)
nRBC: 0 % (ref 0.0–0.2)

## 2022-01-03 LAB — BASIC METABOLIC PANEL
Anion gap: 10 (ref 5–15)
BUN: 16 mg/dL (ref 6–20)
CO2: 26 mmol/L (ref 22–32)
Calcium: 8.9 mg/dL (ref 8.9–10.3)
Chloride: 96 mmol/L — ABNORMAL LOW (ref 98–111)
Creatinine, Ser: 0.7 mg/dL (ref 0.61–1.24)
GFR, Estimated: >60 mL/min (ref >60)
Glucose, Bld: 127 mg/dL — ABNORMAL HIGH (ref 70–99)
Potassium: 4.5 mmol/L (ref 3.5–5.1)
Sodium: 132 mmol/L — ABNORMAL LOW (ref 135–145)

## 2022-01-03 LAB — LIPID PANEL
Cholesterol: 161 mg/dL (ref 0–200)
HDL: 62 mg/dL (ref >40)
LDL Cholesterol: 88 mg/dL (ref 0–99)
Total CHOL/HDL Ratio: 2.6 RATIO
Triglycerides: 56 mg/dL (ref <150)
VLDL: 11 mg/dL (ref 0–40)

## 2022-01-03 LAB — HEMOGLOBIN A1C
Hgb A1c MFr Bld: 5.4 % (ref 4.8–5.6)
Mean Plasma Glucose: 108.28 mg/dL

## 2022-01-03 LAB — TSH: TSH: 3.396 u[IU]/mL (ref 0.350–4.500)

## 2022-01-03 MED ORDER — THIAMINE HCL 100 MG PO TABS
100.0000 mg | ORAL_TABLET | Freq: Every day | ORAL | Status: DC
  Administered 2022-01-04 – 2022-01-08 (×5): 100 mg via ORAL
  Filled 2022-01-03 (×6): qty 1

## 2022-01-03 MED ORDER — ADULT MULTIVITAMIN W/MINERALS CH
1.0000 | ORAL_TABLET | Freq: Every day | ORAL | Status: DC
  Administered 2022-01-03 – 2022-01-08 (×6): 1 via ORAL
  Filled 2022-01-03 (×8): qty 1

## 2022-01-03 MED ORDER — IBUPROFEN 600 MG PO TABS
600.0000 mg | ORAL_TABLET | Freq: Three times a day (TID) | ORAL | Status: DC | PRN
  Administered 2022-01-03 – 2022-01-07 (×7): 600 mg via ORAL
  Filled 2022-01-03 (×7): qty 1

## 2022-01-03 MED ORDER — LORAZEPAM 1 MG PO TABS: 1.0000 mg | ORAL_TABLET | Freq: Four times a day (QID) | ORAL | Status: AC | PRN

## 2022-01-03 MED ORDER — LORATADINE 10 MG PO TABS
10.0000 mg | ORAL_TABLET | Freq: Every day | ORAL | Status: DC
  Administered 2022-01-03 – 2022-01-08 (×6): 10 mg via ORAL
  Filled 2022-01-03 (×8): qty 1

## 2022-01-03 MED ORDER — ACETAMINOPHEN 325 MG PO TABS
650.0000 mg | ORAL_TABLET | Freq: Four times a day (QID) | ORAL | Status: DC | PRN
  Administered 2022-01-03 – 2022-01-07 (×3): 650 mg via ORAL
  Filled 2022-01-03 (×3): qty 2

## 2022-01-03 MED ORDER — ASPIRIN 81 MG PO TBEC
81.0000 mg | DELAYED_RELEASE_TABLET | Freq: Every day | ORAL | Status: DC
  Administered 2022-01-03 – 2022-01-08 (×6): 81 mg via ORAL
  Filled 2022-01-03 (×8): qty 1

## 2022-01-03 MED ORDER — NICOTINE POLACRILEX 2 MG MT GUM
2.0000 mg | CHEWING_GUM | OROMUCOSAL | Status: DC | PRN
  Administered 2022-01-03 – 2022-01-08 (×19): 2 mg via ORAL
  Filled 2022-01-03 (×15): qty 1

## 2022-01-03 MED ORDER — HYDROXYZINE HCL 25 MG PO TABS
25.0000 mg | ORAL_TABLET | Freq: Four times a day (QID) | ORAL | Status: AC | PRN
  Administered 2022-01-03 – 2022-01-05 (×3): 25 mg via ORAL
  Filled 2022-01-03 (×3): qty 1

## 2022-01-03 MED ORDER — ONDANSETRON 4 MG PO TBDP: 4.0000 mg | ORAL_TABLET | Freq: Four times a day (QID) | ORAL | Status: AC | PRN

## 2022-01-03 MED ORDER — LOPERAMIDE HCL 2 MG PO CAPS: 2.0000 mg | ORAL_CAPSULE | ORAL | Status: AC | PRN
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
## 2022-01-03 NOTE — Group Note (Signed)
[place] LCSW Group Therapy Note  01/03/2022   10:00-11:00AM  Type of Therapy and Topic:  Group Therapy:  Unhealthy versus Healthy Supports, Which Am I?  Participation Level:  Active although he kept coming in and out of group and was angry about the door locking  Description of Group:  Patients in this group were introduced to the concept that additional supports including self-support are an essential part of recovery.  Initially a discussion was held about the differences between healthy versus unhealthy supports.  Patients were asked to share what unhealthy supports in their lives need to be addressed, as well as what additional healthy supports could be added for greater help in reaching their goals.   A song entitled "My Own Hero" was played and a group discussion ensued in which patients stated they could relate to the song and it inspired them to realize they have be willing to help themselves in order to succeed, because other people cannot achieve sobriety or stability for them.  We discussed adding a variety of healthy supports to address the various needs in patient lives, including becoming more self-supportive.  A song was played called "I Am Enough" toward the end of group and used to conduct an inspirational wrap-up to group of remembering that they are indeed enough in this life.  Therapeutic Goals: 1)  Highlight the differences between healthy and unhealthy supports 2)  Suggest the importance of being a part of one's own support system 2)  Discuss reasons people in one's life may eventually be unable to be continually supportive  3)  Identify the patient's current support system   4) Elicit commitments to add healthy supports and to become more conscious of being self-supportive   Summary of Patient Progress:  The patient listed as healthy supports the following:  the money he gets monthly, Medicaid, and Medicare.  The patient expressed that unhealthy supports to be addressed include  nos people.  Patient feels they are a healthy support for themselves.  Healthy supports which could be added for increased stability and happiness include a pain doctor.  Therapeutic Modalities:   Motivational Interviewing Activity  Maretta Los , MSW, LCSW

## 2022-01-03 NOTE — Progress Notes (Signed)
Adult Psychoeducational Group Note  Date:  01/03/2022 Time:  10:44 PM  Group Topic/Focus:  Wrap-Up Group:   The focus of this group is to help patients review their daily goal of treatment and discuss progress on daily workbooks.  Participation Level:  Active  Participation Quality:  Appropriate  Affect:  Appropriate  Cognitive:  Appropriate  Insight: Appropriate  Engagement in Group:  Developing/Improving  Modes of Intervention:  Discussion  Additional Comments:  Pt stated his goal for today was to focus on his treatment plan and to talked with his doctor about his pain issues. Pt stated he accomplished his goals today. Pt stated he talked with his doctor and social worker about his care today. Pt rated his overall day a 7 out of 10. Pt stated he was able to contact his sister and his brother today which improved his overall day. Pt stated he felt better about himself today. Pt stated he was able to attend all meals. Pt stated he took all medications provided today. Pt stated he attend all groups held today. Pt stated his appetite was pretty good today. Pt rated sleep last night was pretty good. Pt stated the goal tonight was to get some rest. Pt stated he had some physical pain tonight. Pt stated he had some severe pain in his back and ribs tonight. Pt rated the severe pain in his back and ribs a 10 on the pain level scale. Pt nurse was updated on the situation. Pt deny visual hallucinations and auditory issues tonight. Pt denies thoughts of harming himself or others. Pt stated he would alert staff if anything changed  Felipa Furnace 01/03/2022, 10:44 PM

## 2022-01-03 NOTE — [facility] Group Notes (Signed)
.  Psychoeducational Group Note    Date:  5/28//23 Time: 1300-1400    Purpose of Group: . The group focus' on teaching patients on how to identify their needs and their Life Skills:  A group where two lists are made. What people need and what are things that we do that are unhealthy. The lists are developed by the patients and it is explained that we often do the actions that are not healthy to get our list of needs met.  Goal:: to develop the coping skills needed to get their needs met  Participation Level:  did not attend the group.  Carlos Johnson

## 2022-01-03 NOTE — Progress Notes (Signed)
   01/03/22 0900  Psych Admission Type (Psych Patients Only)  Admission Status Involuntary  Psychosocial Assessment  Patient Complaints None  Eye Contact Brief  Facial Expression Flat  Affect Flat  Speech Logical/coherent  Interaction Minimal  Motor Activity Slow  Appearance/Hygiene Disheveled  Behavior Characteristics Cooperative  Mood Pleasant  Thought Process  Coherency Flight of ideas  Content Preoccupation  Delusions None reported or observed  Perception WDL  Hallucination None reported or observed  Judgment Poor  Confusion None  Danger to Self  Current suicidal ideation? Denies  Danger to Others  Danger to Others None reported or observed

## 2022-01-03 NOTE — Progress Notes (Signed)
   01/02/22 2140  Psych Admission Type (Psych Patients Only)  Admission Status Involuntary  Psychosocial Assessment  Patient Complaints None  Eye Contact Brief  Facial Expression Flat  Affect Flat  Speech Logical/coherent  Interaction Minimal  Motor Activity Slow  Appearance/Hygiene In scrubs  Behavior Characteristics Cooperative;Appropriate to situation  Mood Pleasant  Aggressive Behavior  Targets Other (Comment)  Type of Behavior Other (Comment)  Effect No apparent injury  Thought Chartered certified accountant of ideas  Content Preoccupation  Delusions None reported or observed  Perception WDL  Hallucination None reported or observed  Judgment Poor  Confusion None  Danger to Self  Current suicidal ideation? Denies  Danger to Others  Danger to Others None reported or observed

## 2022-01-03 NOTE — [facility] Group Notes (Signed)
Adult Psychoeducational Group Not Date:  01/03/2022 Time:  [id number] Group Topic/Focus: PROGRESSIVE RELAXATION. A group where deep breathing is taught and tensing and relaxation muscle groups is used. Imagery is used as well.  Pts are asked to imagine 3 pillars that hold them up when they are not able to hold themselves up and to share that with the group.  Participation Level:  Active  Participation Quality:  Appropriate  Affect:  Appropriate  Cognitive:  Oriented  Insight: Improving  Engagement in Group:  Engaged  Modes of Intervention:  Activity, Discussion, Education, and Support  Additional Comments:  Rates his energy at a 3. States the Father, the Son, and the Lesia Hausen hold him up.  Paulino Rily

## 2022-01-04 ENCOUNTER — Encounter (HOSPITAL_COMMUNITY): Payer: Self-pay

## 2022-01-04 DIAGNOSIS — I10 Essential (primary) hypertension: Secondary | ICD-10-CM

## 2022-01-04 LAB — BASIC METABOLIC PANEL
Anion gap: 7 (ref 5–15)
BUN: 19 mg/dL (ref 6–20)
CO2: 26 mmol/L (ref 22–32)
Calcium: 8.9 mg/dL (ref 8.9–10.3)
Chloride: 104 mmol/L (ref 98–111)
Creatinine, Ser: 0.75 mg/dL (ref 0.61–1.24)
GFR, Estimated: >60 mL/min (ref >60)
Glucose, Bld: 134 mg/dL — ABNORMAL HIGH (ref 70–99)
Potassium: 4.2 mmol/L (ref 3.5–5.1)
Sodium: 137 mmol/L (ref 135–145)

## 2022-01-04 LAB — ROCKY MTN SPOTTED FVR ABS PNL(IGG+IGM)
RMSF IgG: NEGATIVE
RMSF IgM: 0.27 index (ref 0.00–0.89)

## 2022-01-04 MED ORDER — LOSARTAN POTASSIUM 50 MG PO TABS
25.0000 mg | ORAL_TABLET | Freq: Every day | ORAL | Status: DC
  Administered 2022-01-04 – 2022-01-08 (×5): 25 mg via ORAL
  Filled 2022-01-04: qty 1
  Filled 2022-01-04 (×6): qty 0.5

## 2022-01-04 MED ORDER — LOSARTAN POTASSIUM 50 MG PO TABS
25.0000 mg | ORAL_TABLET | Freq: Every day | ORAL | Status: DC
  Filled 2022-01-04 (×2): qty 1

## 2022-01-04 NOTE — [facility] Group Notes (Signed)
  Spiritual care group on grief and loss facilitated by chaplain Janne Napoleon, [facility]   Group Goal:   Support / Education around grief and loss   Members engage in facilitated group support and psycho-social education.   Group Description:   Following introductions and group rules, group members engaged in facilitated group dialog and support around topic of loss, with particular support around experiences of loss in their lives. Group Identified types of loss (relationships / self / things) and identified patterns, circumstances, and changes that precipitate losses. Reflected on thoughts / feelings around loss, normalized grief responses, and recognized variety in grief experience. Group noted Worden's four tasks of grief in discussion.   Group drew on Adlerian / Rogerian, narrative, MI,   Patient Progress: Carlos Johnson was in and out of group.  Some of his comments were on topic and others were tangential.  He attempted to help another group member who shared about the loss of his mother, but his comments were not appropriate or helpful for the other patient.  When redirection was attempted, he left group abruptly.  [address], [place] Pager, [phone]

## 2022-01-04 NOTE — [facility] Group Notes (Signed)
Pt did not attend Psychoeducational group. 

## 2022-01-04 NOTE — Group Note (Signed)
[facility] LCSW Group Therapy Note   Group Date: 01/04/2022 Start Time: 1300 End Time: 1330   Type of Therapy/Topic:  Group Therapy:  Emotion Regulation  Participation Level:  Did Not Attend   Mood:  Description of Group:    The purpose of this group is to assist patients in learning to regulate negative emotions and experience positive emotions. Patients will be guided to discuss ways in which they have been vulnerable to their negative emotions. These vulnerabilities will be juxtaposed with experiences of positive emotions or situations, and patients challenged to use positive emotions to combat negative ones. Special emphasis will be placed on coping with negative emotions in conflict situations, and patients will process healthy conflict resolution skills.  Therapeutic Goals: Patient will identify two positive emotions or experiences to reflect on in order to balance out negative emotions:  Patient will label two or more emotions that they find the most difficult to experience:  Patient will be able to demonstrate positive conflict resolution skills through discussion or role plays:   Summary of Patient Progress:   Patient did not attend group despite encouraged participation.     Therapeutic Modalities:   Cognitive Behavioral Therapy Feelings Identification Dialectical Behavioral Therapy   Admir Candelas W Suha Schoenbeck, LCSWA 

## 2022-01-04 NOTE — Progress Notes (Signed)
[facility] MD Progress Note  01/04/2022 2:53 PM Carlos Johnson  MRN:  [id number]  Chief Complaint: psychosis   Reason for Admission:  Carlos Johnson is a 61 y.o. male with reported history of bipolar disorder and polysubstance abuse, who was admitted under IVC to the behavioral health hospital from [facility] ED due to concerns by his sister for recent erratic behaviors.  Apparently the patient had been attempting to walk into oncoming traffic, had not been compliant with his medications, and had been seen talking to unseen others prior to admission.  His UDS on admission was positive for amphetamines. The patient is currently on Hospital Day 3.   Chart Review from last 24 hours:  The patient's chart was reviewed and nursing notes were reviewed. The patient's case was discussed in multidisciplinary team meeting. Per nursing, patient attended some groups. He appeared disheveled and had evidence of flight of ideas yesterday. He reportedly has asked repeatedly for opiate pain medication. Per [facility] he was compliant with scheduled medications. He did receive Tylenol X1 and Motrin X2 yesterday for pain, Vistaril X1 for anxiety, and Nicotine gum PRN.   Information Obtained Today During Patient Interview: The patient was seen and evaluated on the unit. On assessment today the patient reports that he is not sure why he is in the hospital.  During the interview he is tangential and at times disorganized.  He abruptly leaves the interview at 1 point to use the restroom and when attempts were made to reengage in the interview he has difficulty understanding appropriate body space distancing and attempts to sit too close to this examiner.  He recognizes that that he is in [place] and that it is May 2023.  He states that he believes his sister exaggerated his symptoms prior to admission and he does not recognize that he was acting erratically or psychotically on arrival.  He admits to buying pain pills and  stimulant medication off the street prior to admission and shows no insight into his drug use with discussion today.  He states he has cravings for nicotine and adderall today.  He is ruminative about pain medication management and was advised that according to the PDMP he has not filled any opiates since 2022, and given his addiction history, and we would not be giving opiates during this admission.  He was advised that he has Tylenol and Motrin which he can use as well as a Lidoderm patch and was encouraged to comply with this regimen.  He states he is sleeping and eating well.  He denies any SI, HI, AVH, paranoia, ideas of reference or first rank symptoms.   He states that the tick bites and rash on his buttock are improving.Other than chronic low back pain, he denies other physical complaints today.  When questioned about compliance with his blood pressure meds prior to admission he states he was not taking any Lasix but was taking his diltiazem.  Advised that we will try to reach his sister for additional lateral and he gives consent for me to try to talk with her again today.  Principal Problem: Schizophrenia spectrum disorder with psychotic disorder type not yet determined (HCC) Diagnosis: Principal Problem:   Schizophrenia spectrum disorder with psychotic disorder type not yet determined (HCC) Active Problems:   Amphetamine abuse (HCC)  Total Time Spent in Direct Patient Care:  I personally spent 30 minutes on the unit in direct patient care. The direct patient care time included face-to-face time with the patient,  reviewing the patient's chart, communicating with other professionals, and coordinating care. Greater than 50% of this time was spent in counseling or coordinating care with the patient regarding goals of hospitalization, psycho-education, and discharge planning needs.   Past Psychiatric History: see H&P   Past Medical History:  Past Medical History:  Diagnosis Date   Anxiety     Depression    GERD (gastroesophageal reflux disease)    Herpes     Past Surgical History:  Procedure Laterality Date   SHOULDER SURGERY Right 2001   TONSILLECTOMY Bilateral    WISDOM TOOTH EXTRACTION Bilateral    Family History:  Family History  Problem Relation Age of Onset   Asthma Mother    Cancer Father        lung cancer   Hypertension Father    Asthma Daughter    Family Psychiatric  History: see H&P  Social History:  Social History   Substance and Sexual Activity  Alcohol Use Yes   Alcohol/week: 1.0 - 2.0 standard drink   Types: 1 - 2 Cans of beer per week   Comment: occ     Social History   Substance and Sexual Activity  Drug Use Not Currently   Types: Oxycodone, Hydrocodone, Heroin, Nitrous oxide   Comment:  pt denies use 02-08-18    Social History   Socioeconomic History   Marital status: Single    Spouse name: Not on file   Number of children: 1   Years of education: Not on file   Highest education level: GED or equivalent  Occupational History   Not on file  Tobacco Use   Smoking status: Every Day    Packs/day: 0.50    Years: 36.00    Pack years: 18.00    Types: Cigarettes   Smokeless tobacco: Never  Vaping Use   Vaping Use: Never used  Substance and Sexual Activity   Alcohol use: Yes    Alcohol/week: 1.0 - 2.0 standard drink    Types: 1 - 2 Cans of beer per week    Comment: occ   Drug use: Not Currently    Types: Oxycodone, Hydrocodone, Heroin, Nitrous oxide    Comment:  pt denies use 02-08-18   Sexual activity: Not Currently  Other Topics Concern   Not on file  Social History Narrative   Not on file   Social Determinants of Health   Financial Resource Strain: Not on file  Food Insecurity: Not on file  Transportation Needs: Not on file  Physical Activity: Not on file  Stress: Not on file  Social Connections: Not on file    Sleep: Good  Appetite:  Good  Current Medications: Current Facility-Administered Medications   Medication Dose Route Frequency Provider Last Rate Last Admin   acetaminophen (TYLENOL) tablet 650 mg  650 mg Oral Q6H PRN Mason Jim, Nikai Quest E, MD   650 mg at 01/04/22 1200   aspirin EC tablet 81 mg  81 mg Oral Daily Mallory Enriques E, MD   81 mg at 01/04/22 1610   benztropine (COGENTIN) tablet 0.5 mg  0.5 mg Oral BID PRN Comer Locket, MD       cloNIDine (CATAPRES) tablet 0.1 mg  0.1 mg Oral Q8H PRN Mason Jim, Sulay Brymer E, MD       diltiazem (DILACOR XR) 24 hr capsule 180 mg  180 mg Oral Daily Bobbitt, Shalon E, NP   180 mg at 01/04/22 0828   hydrOXYzine (ATARAX) tablet 25 mg  25 mg Oral Q6H  PRN Comer Locket, MD   25 mg at 01/04/22 3785   ibuprofen (ADVIL) tablet 600 mg  600 mg Oral Q8H PRN Comer Locket, MD   600 mg at 01/04/22 0906   lidocaine (LIDODERM) 5 % 1 patch  1 patch Transdermal Q24H Comer Locket, MD   1 patch at 01/03/22 2039   loperamide (IMODIUM) capsule 2-4 mg  2-4 mg Oral PRN Comer Locket, MD       loratadine (CLARITIN) tablet 10 mg  10 mg Oral Daily Ntuen, Tina C, FNP   10 mg at 01/04/22 0829   OLANZapine zydis (ZYPREXA) disintegrating tablet 5 mg  5 mg Oral Q8H PRN Comer Locket, MD       And   LORazepam (ATIVAN) tablet 1 mg  1 mg Oral PRN Comer Locket, MD       And   ziprasidone (GEODON) injection 20 mg  20 mg Intramuscular PRN Comer Locket, MD       LORazepam (ATIVAN) tablet 1 mg  1 mg Oral Q6H PRN Comer Locket, MD       multivitamin with minerals tablet 1 tablet  1 tablet Oral Daily Comer Locket, MD   1 tablet at 01/04/22 8850   nicotine polacrilex (NICORETTE) gum 2 mg  2 mg Oral PRN Bartholomew Crews E, MD   2 mg at 01/04/22 1204   OLANZapine zydis (ZYPREXA) disintegrating tablet 10 mg  10 mg Oral QHS Mason Jim, Lorieann Argueta E, MD   10 mg at 01/03/22 2037   ondansetron (ZOFRAN-ODT) disintegrating tablet 4 mg  4 mg Oral Q6H PRN Comer Locket, MD       pantoprazole (PROTONIX) EC tablet 40 mg  40 mg Oral Daily Bobbitt, Shalon E, NP   40 mg at 01/04/22 2774    thiamine tablet 100 mg  100 mg Oral Daily Mason Jim, Catalino Plascencia E, MD   100 mg at 01/04/22 1287   traZODone (DESYREL) tablet 50 mg  50 mg Oral QHS PRN Comer Locket, MD       valACYclovir (VALTREX) tablet 1,000 mg  1,000 mg Oral Daily Mason Jim, Bristol Soy E, MD   1,000 mg at 01/04/22 8676    Lab Results:  Results for orders placed or performed during the hospital encounter of 01/01/22 (from the past 48 hour(s))  TSH     Status: None   Collection Time: 01/03/22  6:41 AM  Result Value Ref Range   TSH 3.396 0.350 - 4.500 uIU/mL    Comment: Performed by a 3rd Generation assay with a functional sensitivity of <=0.01 uIU/mL. Performed at [facility], [address] [place], [place] [address]   CBC with Differential/Platelet     Status: None   Collection Time: 01/03/22  6:41 AM  Result Value Ref Range   WBC 7.4 4.0 - 10.5 K/uL   RBC 4.70 4.22 - 5.81 MIL/uL   Hemoglobin 14.3 13.0 - 17.0 g/dL   HCT 70.9 62.8 - 36.6 %   MCV 95.1 80.0 - 100.0 fL   MCH 30.4 26.0 - 34.0 pg   MCHC 32.0 30.0 - 36.0 g/dL   RDW 29.4 76.5 - 46.5 %   Platelets 328 150 - 400 K/uL   nRBC 0.0 0.0 - 0.2 %   Neutrophils Relative % 55 %   Neutro Abs 4.1 1.7 - 7.7 K/uL   Lymphocytes Relative 33 %   Lymphs Abs 2.4 0.7 - 4.0 K/uL   Monocytes Relative  9 %   Monocytes Absolute 0.7 0.1 - 1.0 K/uL   Eosinophils Relative 2 %   Eosinophils Absolute 0.2 0.0 - 0.5 K/uL   Basophils Relative 1 %   Basophils Absolute 0.1 0.0 - 0.1 K/uL   Immature Granulocytes 0 %   Abs Immature Granulocytes 0.02 0.00 - 0.07 K/uL    Comment: Performed at [facility], [address], [place], [place] [address]  Basic metabolic panel     Status: Abnormal   Collection Time: 01/03/22  6:41 AM  Result Value Ref Range   Sodium 132 (L) 135 - 145 mmol/L   Potassium 4.5 3.5 - 5.1 mmol/L   Chloride 96 (L) 98 - 111 mmol/L   CO2 26 22 - 32 mmol/L   Glucose, Bld 127 (H) 70 - 99 mg/dL    Comment: Glucose reference range  applies only to samples taken after fasting for at least 8 hours.   BUN 16 6 - 20 mg/dL   Creatinine, Ser 0.99 0.61 - 1.24 mg/dL   Calcium 8.9 8.9 - 83.3 mg/dL   GFR, Estimated >82 >50 mL/min    Comment: (NOTE) Calculated using the CKD-EPI Creatinine Equation (2021)    Anion gap 10 5 - 15    Comment: Performed at [facility], [address] [place], [place] [address]  Lipid panel     Status: None   Collection Time: 01/03/22  6:41 AM  Result Value Ref Range   Cholesterol 161 0 - 200 mg/dL   Triglycerides 56 <734 mg/dL   HDL 62 >19 mg/dL   Total CHOL/HDL Ratio 2.6 RATIO   VLDL 11 0 - 40 mg/dL   LDL Cholesterol 88 0 - 99 mg/dL    Comment:        Total Cholesterol/HDL:CHD Risk Coronary Heart Disease Risk Table                     Men   Women  1/2 Average Risk   3.4   3.3  Average Risk       5.0   4.4  2 X Average Risk   9.6   7.1  3 X Average Risk  23.4   11.0        Use the calculated Patient Ratio above and the CHD Risk Table to determine the patient's CHD Risk.        ATP III CLASSIFICATION (LDL):  <100     mg/dL   Optimal  379-024  mg/dL   Near or Above                    Optimal  130-159  mg/dL   Borderline  097-353  mg/dL   High  >299     mg/dL   Very High Performed at [facility], [address] [place], [place] [address]   Hemoglobin A1c     Status: None   Collection Time: 01/03/22  6:41 AM  Result Value Ref Range   Hgb A1c MFr Bld 5.4 4.8 - 5.6 %    Comment: (NOTE) Pre diabetes:          5.7%-6.4%  Diabetes:              >6.4%  Glycemic control for   <7.0% adults with diabetes    Mean Plasma Glucose 108.28 mg/dL    Comment: Performed at [facility], [address], [place], [place] [address]  Basic  metabolic panel     Status: Abnormal   Collection Time: 01/04/22  6:28 AM  Result Value Ref Range   Sodium 137 135 - 145 mmol/L   Potassium 4.2 3.5 - 5.1 mmol/L   Chloride 104 98 - 111 mmol/L   CO2 26 22 - 32  mmol/L   Glucose, Bld 134 (H) 70 - 99 mg/dL    Comment: Glucose reference range applies only to samples taken after fasting for at least 8 hours.   BUN 19 6 - 20 mg/dL   Creatinine, Ser 1.61 0.61 - 1.24 mg/dL   Calcium 8.9 8.9 - 09.6 mg/dL   GFR, Estimated >04 >54 mL/min    Comment: (NOTE) Calculated using the CKD-EPI Creatinine Equation (2021)    Anion gap 7 5 - 15    Comment: Performed at [facility], [address] [place], [place] [address]    Blood Alcohol level:  Lab Results  Component Value Date   ETH <10 12/30/2021   ETH <10 09/25/2021    Metabolic Disorder Labs: Lab Results  Component Value Date   HGBA1C 5.4 01/03/2022   MPG 108.28 01/03/2022   No results found for: PROLACTIN Lab Results  Component Value Date   CHOL 161 01/03/2022   TRIG 56 01/03/2022   HDL 62 01/03/2022   CHOLHDL 2.6 01/03/2022   VLDL 11 01/03/2022   LDLCALC 88 01/03/2022   LDLCALC 106 (H) 09/26/2021    Physical Findings: CIWA:  CIWA-Ar Total: 4   Musculoskeletal: Strength & Muscle Tone: within normal limits Gait & Station: normal Patient leans: Front  Psychiatric Specialty Exam:  Presentation  General Appearance:  unkempt, appears older than stated age  Eye Contact:Brief  Speech:mumbling quality, normal fluency and rate  Speech Volume:Normal  Mood and Affect  Mood:described as "good" - appears ambivalent and at times irritable  Affect: constricted, at times irritable   Thought Process  Thought Processes: tangential and at times disorganized  Orientation:oriented to self, month, year and city but not situation  Thought Content: Denies AVH, paranoia, ideas of reference, first rank symptoms, SI or HI and is not grossly responding to internal/external stimuli on exam  Hallucinations:Denied  Ideas of Reference:None  Suicidal Thoughts:Suicidal Thoughts: No  Homicidal Thoughts:Homicidal Thoughts: No   Sensorium  Memory:Poor  Judgment:Fair -  taking meds on the unit  Insight:Poor   Executive Functions  Concentration:Poor  Attention Span:Poor  Recall:Untested  Fund of Knowledge:Fair  Language:Fair   Psychomotor Activity  Psychomotor Activity:Fidgety - abruptly leaves interview to go to restroom at one point   Assets  Assets:Resilience   Sleep  4.45 hours   Physical Exam Vitals and nursing note reviewed.  HENT:     Head: Normocephalic.  Pulmonary:     Effort: Pulmonary effort is normal.  Skin:    Comments: Herpetic lesions on buttock are scabbing over - no signs of secondary infection  Neurological:     Mental Status: He is alert.   Review of Systems  Respiratory:  Negative for shortness of breath.   Cardiovascular:  Negative for chest pain.  Gastrointestinal:  Negative for abdominal pain, constipation, diarrhea, nausea and vomiting.  Musculoskeletal:  Positive for back pain.  Blood pressure (!) 145/97, pulse 99, temperature 98 F (36.7 C), temperature source Oral, resp. rate 16, height  (1.702 m), weight 71.3 kg, SpO2 99 %. Body mass index is 24.62 kg/m.   Treatment Plan Summary: Unspecified schizophrenia spectrum and other psychotic d/o (r/o SIPD, r/o schizoaffective d/o,  r/o bipolar mania MRE severe with psychotic features) Stimulant use d/o - amphetamine type R/o alcohol use d/o H/o CVA Genital herpes Tick bite HTN Chronic low back pain with h/o L1 compression fracture   PLAN: Safety and Monitoring:             -- Involuntary admission to inpatient psychiatric unit for safety, stabilization and treatment             -- Daily contact with patient to assess and evaluate symptoms and progress in treatment             -- Patient's case to be discussed in multi-disciplinary team meeting             -- Observation Level : q15 minute checks             -- Vital signs:  q12 hours             -- Precautions: suicide, elopement, and assault   2. Psychiatric Diagnoses and Treatment:               Unspecified schizophrenia spectrum and other psychotic d/o (r/o SIPD, r/o schizoaffective d/o, r/o bipolar mania MRE severe with psychotic features)             -- Continue Zyprexa zydis   qhs tonight and monitor for symptom improvement as he metabolizes amphetamines in his system Lipid Panel: WNL other than LDL 106; HbgA1c: 5.4; QTc:442ms             -- Cogentin 0.5mg  PRN EPS             -- Vistaril  q6 hours PRN anxiety             -- Trazodone  and will schedule tonight for help with sleep             -- Agitation protocol zyprexa/ativan/Geodon             -- Attempting to get collateral from sister Darl Pikes [phone]                 -- Encouraged patient to participate in unit milieu and in scheduled group therapies                           Stimulant use d/o - amphetamine type             R/o alcohol use d/o             -- will need meaningful discussion about need to abstain from alcohol, illicit drug and prescription drug abuse as he clears             -- Since he is an unreliable historian and has h/o alcohol abuse will place on CIWA for monitoring with MVI oral replacement (recent scores 1,4)     3. Medical Issues Being Addressed:              HTN             -- Restarted home Diltiazem  daily              -- Hold Lasix due to drop in Na+ to 132             -- Clonidine 0.1mg  PRN SBP >160 or DBP >110   -- Have consulted hospitalist for additional BP med recommendations  Chronic back pain             -- Continue Lidoderm patch              -- No opiates given addiction history (Per PDMP has not filled opiates since 9/22)             -- Patient clarified having no allergy to Tylenol or Motrin and meds ordered PRN               H/o CVA             -- Restarting ASA 81mg  daily               Tick Bite             -- NP spoke to ID who recommends no antibiotic at this time given afebrile             - RMSF pending and lyme panel negative                Genital Herpes             -- NP spoke to ID who recommended restart of Valtrex             -- Restarted Valtrex 1000mg  daily for 5 days and then reassess need for ongoing daily prophylaxis               Leukocytosis - resolved             -- Repeat CBC down to 7.4             -- Afebrile               Mild hyponatremia - resolved             -- Holding lasix and monitoring BMP - repeat Na+ today 137 after Lasix held               Tobacco Use d/o             -- Nicotine gum PRN              Congestion -Initiate Claritin 10 mg po daily   4. Discharge Planning:              -- Social work and case management to assist with discharge planning and identification of hospital follow-up needs prior to discharge             -- Estimated LOS: 5-7 days             -- Discharge Concerns: Need to establish a safety plan; Medication compliance and effectiveness             -- Discharge Goals: Return home with outpatient referrals for mental health follow-up including medication management/psychotherapy  Comer LocketAmy E Everly Rubalcava, MD, FAPA 01/04/2022, 2:53 PM

## 2022-01-04 NOTE — [facility] Suicide Risk Assessment (Signed)
[facility] INPATIENT:  Family/Significant Other Suicide Prevention Education  Suicide Prevention Education:  Contact Attempts: Dannielle Huh, brother-in-law, [phone] has been identified by the patient as the family member/significant other with whom the patient will be residing, and identified as the person(s) who will aid the patient in the event of a mental health crisis.  With written consent from the patient, two attempts were made to provide suicide prevention education, prior to and/or following the patient's discharge.  We were unsuccessful in providing suicide prevention education.  A suicide education pamphlet was given to the patient to share with family/significant other.  Date and time of first attempt: 01/04/2022 at 3:26 PM Date and time of second attempt:CSW will make additional attempts to reach collateral to completed SPE.   Corky Crafts 01/04/2022, 3:25 PM

## 2022-01-04 NOTE — Plan of Care (Signed)
Nurse discussed anxiety, depression and coping skills with patient.  

## 2022-01-04 NOTE — Progress Notes (Signed)
The patient attended the evening A.A.meeting and was appropriate.  

## 2022-01-04 NOTE — Progress Notes (Signed)
   01/03/22 2045  Psych Admission Type (Psych Patients Only)  Admission Status Involuntary  Psychosocial Assessment  Eye Contact Brief  Facial Expression Flat  Affect Flat  Speech Logical/coherent  Interaction Minimal  Motor Activity Slow  Appearance/Hygiene In scrubs  Aggressive Behavior  Targets Other (Comment)  Type of Behavior Other (Comment)  Effect No apparent injury  Thought Chartered certified accountant of ideas  Content Preoccupation  Delusions None reported or observed  Perception WDL  Hallucination None reported or observed  Judgment Poor  Confusion None  Danger to Self  Current suicidal ideation? Denies  Danger to Others  Danger to Others None reported or observed

## 2022-01-04 NOTE — Progress Notes (Signed)
Patient denied SI and HI, contracts for safety.  Denied A/V hallucinations.   Medications administered per MD orders.  Emotional support and encouragement given patient. Safety maintained with 15 minute checks.  

## 2022-01-04 NOTE — BH IP Treatment Plan (Signed)
Interdisciplinary Treatment and Diagnostic Plan Update  01/04/2022 Time of Session: [place] MRN: [id number]  Principal Diagnosis: Schizophrenia spectrum disorder with psychotic disorder type not yet determined [facility])  Secondary Diagnoses: Principal Problem:   Schizophrenia spectrum disorder with psychotic disorder type not yet determined ([place]) Active Problems:   Amphetamine abuse ([place])   HTN (hypertension)   Current Medications:  Current Facility-Administered Medications  Medication Dose Route Frequency Provider Last Rate Last Admin   acetaminophen (TYLENOL) tablet 650 mg  650 mg Oral Q6H PRN Harlow Asa, MD   650 mg at 01/04/22 1200   aspirin EC tablet 81 mg  81 mg Oral Daily Nelda Marseille, Amy E, MD   81 mg at 01/04/22 [id number]   benztropine (COGENTIN) tablet 0.5 mg  0.5 mg Oral BID PRN Harlow Asa, MD       cloNIDine (CATAPRES) tablet 0.1 mg  0.1 mg Oral Q8H PRN Harlow Asa, MD       diltiazem (DILACOR XR) 24 hr capsule 180 mg  180 mg Oral Daily Bobbitt, Shalon E, NP   180 mg at 01/04/22 [id number]   hydrOXYzine (ATARAX) tablet 25 mg  25 mg Oral Q6H PRN Harlow Asa, MD   25 mg at 01/04/22 0834   ibuprofen (ADVIL) tablet 600 mg  600 mg Oral Q8H PRN Harlow Asa, MD   600 mg at 01/04/22 0906   lidocaine (LIDODERM) 5 % 1 patch  1 patch Transdermal Q24H Harlow Asa, MD   1 patch at 01/03/22 2039   loperamide (IMODIUM) capsule 2-4 mg  2-4 mg Oral PRN Harlow Asa, MD       loratadine (CLARITIN) tablet 10 mg  10 mg Oral Daily Ntuen, Kris Hartmann, FNP   10 mg at 01/04/22 0829   OLANZapine zydis (ZYPREXA) disintegrating tablet 5 mg  5 mg Oral Q8H PRN Harlow Asa, MD       And   LORazepam (ATIVAN) tablet 1 mg  1 mg Oral PRN Harlow Asa, MD       And   ziprasidone (GEODON) injection 20 mg  20 mg Intramuscular PRN Harlow Asa, MD       LORazepam (ATIVAN) tablet 1 mg  1 mg Oral Q6H PRN Harlow Asa, MD       losartan (COZAAR) tablet 25 mg  25 mg  Oral Daily Kyle, Tyrone A, DO       multivitamin with minerals tablet 1 tablet  1 tablet Oral Daily Harlow Asa, MD   1 tablet at 01/04/22 [id number]   nicotine polacrilex (NICORETTE) gum 2 mg  2 mg Oral PRN Viann Fish E, MD   2 mg at 01/04/22 1452   OLANZapine zydis (ZYPREXA) disintegrating tablet 10 mg  10 mg Oral QHS Nelda Marseille, Amy E, MD   10 mg at 01/03/22 2037   ondansetron (ZOFRAN-ODT) disintegrating tablet 4 mg  4 mg Oral Q6H PRN Harlow Asa, MD       pantoprazole (PROTONIX) EC tablet 40 mg  40 mg Oral Daily Bobbitt, Shalon E, NP   40 mg at 01/04/22 [id number]   thiamine tablet 100 mg  100 mg Oral Daily Nelda Marseille, Amy E, MD   100 mg at 01/04/22 [id number]   traZODone (DESYREL) tablet 50 mg  50 mg Oral QHS PRN Harlow Asa, MD       valACYclovir (VALTREX) tablet 1,000 mg  1,000 mg Oral Daily Harlow Asa, MD   1,000  mg at 01/04/22 [id number]   PTA Medications: Medications Prior to Admission  Medication Sig Dispense Refill Last Dose   acyclovir (ZOVIRAX) 400 MG tablet Take 1 tablet (400 mg total) by mouth 3 (three) times daily. (Patient not taking: Reported on 01/01/2022) 21 tablet 1    aspirin EC 325 MG tablet Take 1 tablet (325 mg total) by mouth daily. (Patient not taking: Reported on 01/01/2022) 30 tablet 0    cetirizine (ZYRTEC ALLERGY) 10 MG tablet Take 1 tablet (10 mg total) by mouth daily. (Patient not taking: Reported on 01/01/2022) 30 tablet 0    diltiazem (CARDIZEM LA) 180 MG 24 hr tablet Take 180 mg by mouth daily.      furosemide (LASIX) 20 MG tablet Take 20 mg by mouth daily.      ibuprofen (ADVIL,MOTRIN) 600 MG tablet Take 1 tablet (600 mg total) by mouth every 6 (six) hours as needed. (Patient not taking: Reported on 01/01/2022) 30 tablet 0    OLANZapine zydis (ZYPREXA) 10 MG disintegrating tablet Take 1 tablet (10 mg total) by mouth daily. (Patient not taking: Reported on 01/01/2022) 30 tablet 0    omeprazole (PRILOSEC) 20 MG capsule Take 1 capsule (20 mg total) by mouth daily.  (Patient not taking: Reported on 01/01/2022) 30 capsule 1    valACYclovir (VALTREX) 500 MG tablet Take 500 mg by mouth daily.       Patient Stressors: Medication change or noncompliance    Patient Strengths: Ability for insight  Supportive family/friends   Treatment Modalities: Medication Management, Group therapy, Case management,  1 to 1 session with clinician, Psychoeducation, Recreational therapy.   Physician Treatment Plan for Primary Diagnosis: Schizophrenia spectrum disorder with psychotic disorder type not yet determined ([place]) Long Term Goal(s): Improvement in symptoms so as ready for discharge   Short Term Goals: Ability to identify changes in lifestyle to reduce recurrence of condition will improve Ability to verbalize feelings will improve Ability to disclose and discuss suicidal ideas Ability to demonstrate self-control will improve Ability to identify and develop effective coping behaviors will improve Ability to maintain clinical measurements within normal limits will improve Compliance with prescribed medications will improve Ability to identify triggers associated with substance abuse/mental health issues will improve  Medication Management: Evaluate patient's response, side effects, and tolerance of medication regimen.  Therapeutic Interventions: 1 to 1 sessions, Unit Group sessions and Medication administration.  Evaluation of Outcomes: Progressing  Physician Treatment Plan for Secondary Diagnosis: Principal Problem:   Schizophrenia spectrum disorder with psychotic disorder type not yet determined ([place]) Active Problems:   Amphetamine abuse ([place])   HTN (hypertension)  Long Term Goal(s): Improvement in symptoms so as ready for discharge   Short Term Goals: Ability to identify changes in lifestyle to reduce recurrence of condition will improve Ability to verbalize feelings will improve Ability to disclose and discuss suicidal ideas Ability to demonstrate  self-control will improve Ability to identify and develop effective coping behaviors will improve Ability to maintain clinical measurements within normal limits will improve Compliance with prescribed medications will improve Ability to identify triggers associated with substance abuse/mental health issues will improve     Medication Management: Evaluate patient's response, side effects, and tolerance of medication regimen.  Therapeutic Interventions: 1 to 1 sessions, Unit Group sessions and Medication administration.  Evaluation of Outcomes: Progressing   RN Treatment Plan for Primary Diagnosis: Schizophrenia spectrum disorder with psychotic disorder type not yet determined ([place]) Long Term Goal(s): Knowledge of disease and therapeutic regimen to maintain health  will improve  Short Term Goals: Ability to remain free from injury will improve, Ability to verbalize frustration and anger appropriately will improve, Ability to demonstrate self-control, Ability to participate in decision making will improve, Ability to verbalize feelings will improve, Ability to disclose and discuss suicidal ideas, Ability to identify and develop effective coping behaviors will improve, and Compliance with prescribed medications will improve  Medication Management: RN will administer medications as ordered by provider, will assess and evaluate patient's response and provide education to patient for prescribed medication. RN will report any adverse and/or side effects to prescribing provider.  Therapeutic Interventions: 1 on 1 counseling sessions, Psychoeducation, Medication administration, Evaluate responses to treatment, Monitor vital signs and CBGs as ordered, Perform/monitor CIWA, COWS, AIMS and Fall Risk screenings as ordered, Perform wound care treatments as ordered.  Evaluation of Outcomes: Progressing   LCSW Treatment Plan for Primary Diagnosis: Schizophrenia spectrum disorder with psychotic disorder type  not yet determined ([place]) Long Term Goal(s): Safe transition to appropriate next level of care at discharge, Engage patient in therapeutic group addressing interpersonal concerns.  Short Term Goals: Engage patient in aftercare planning with referrals and resources, Increase social support, Increase ability to appropriately verbalize feelings, Increase emotional regulation, Facilitate acceptance of mental health diagnosis and concerns, Facilitate patient progression through stages of change regarding substance use diagnoses and concerns, Identify triggers associated with mental health/substance abuse issues, and Increase skills for wellness and recovery  Therapeutic Interventions: Assess for all discharge needs, 1 to 1 time with Social worker, Explore available resources and support systems, Assess for adequacy in community support network, Educate family and significant other(s) on suicide prevention, Complete Psychosocial Assessment, Interpersonal group therapy.  Evaluation of Outcomes: Progressing   Progress in Treatment: Attending groups: No. Participating in groups: No. Taking medication as prescribed: Yes. Toleration medication: Yes. Family/Significant other contact made: No, will contact:  CSW will make additional attempts to reach [place], brother in law.  Patient understands diagnosis: Yes. Discussing patient identified problems/goals with staff: Yes. Medical problems stabilized or resolved: Yes. Denies suicidal/homicidal ideation: Yes. Issues/concerns per patient self-inventory: Yes. Other: none  New problem(s) identified: No, Describe:  none  New Short Term/Long Term Goal(s): Patient to work towards detox, elimination of symptoms of psychosis, medication management for mood stabilization;  development of comprehensive mental wellness/sobriety plan.  Patient Goals:  Patient states, "gain weight . . . Work part time and get a red truck, no black"   Discharge Plan or Barriers: No  psychosocial barriers identified at this time, patient to return to place of residence when appropriate for discharge.   Reason for Continuation of Hospitalization: Other; describe psychosis  Estimated Length of Stay: 1-7 days    Last 3 [place] Suicide Severity Risk Score: Flowsheet Row Admission (Current) from 01/01/2022 in [place] [facility] ED from 12/30/2021 in [place] ED from 09/25/2021 in [place] No Risk No Risk No Risk      Scribe for Treatment Team: Larose Kells 01/04/2022 5:05 PM
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
## 2022-01-05 DIAGNOSIS — I10 Essential (primary) hypertension: Secondary | ICD-10-CM

## 2022-01-05 LAB — RENAL FUNCTION PANEL
Albumin: 3.5 g/dL (ref 3.5–5.0)
Anion gap: 6 (ref 5–15)
BUN: 17 mg/dL (ref 6–20)
CO2: 28 mmol/L (ref 22–32)
Calcium: 9 mg/dL (ref 8.9–10.3)
Chloride: 103 mmol/L (ref 98–111)
Creatinine, Ser: 0.66 mg/dL (ref 0.61–1.24)
GFR, Estimated: >60 mL/min (ref >60)
Glucose, Bld: 130 mg/dL — ABNORMAL HIGH (ref 70–99)
Phosphorus: 3.6 mg/dL (ref 2.5–4.6)
Potassium: 4.2 mmol/L (ref 3.5–5.1)
Sodium: 137 mmol/L (ref 135–145)

## 2022-01-05 NOTE — Progress Notes (Signed)
NUTRITION ASSESSMENT  Pt identified as at risk on the Malnutrition Screen Tool  INTERVENTION: 1. Supplements: Ensure Plus High Protein po BID, each supplement provides 350 kcal and 20 grams of protein.  2. Needs updated weight  NUTRITION DIAGNOSIS: Unintentional weight loss related to sub-optimal intake as evidenced by pt report.   Goal: Pt to meet >/= 90% of their estimated nutrition needs.  Monitor:  PO intake  Assessment:  Pt admitted with acute psychosis. PMHx of bipolar disorder and polysubstance abuse.  Pt reports no issues with eating.  Per weight records, no new weight recorded since 04/18/21.  Will add supplements given history of substance abuse. Daily MVI and thiamine have been ordered.  Height: Ht Readings from Last 1 Encounters:  01/01/22 5\' 7"  (1.702 m)    Weight: Wt Readings from Last 1 Encounters:  01/01/22 71.3 kg    Weight Hx: Wt Readings from Last 10 Encounters:  01/01/22 71.3 kg  12/30/21 71.3 kg  09/25/21 71.3 kg  08/26/21 71.3 kg  04/18/21 71.3 kg  02/08/18 96.2 kg  08/15/17 126.2 kg  09/03/15 92.1 kg  02/14/15 88 kg    BMI:  Body mass index is 24.62 kg/m. Pt meets criteria for normal based on current BMI.  Estimated Nutritional Needs: Kcal: 25-30 kcal/kg Protein: > 1 gram protein/kg Fluid: 1 ml/kcal  Diet Order:  Diet Order             Diet regular Room service appropriate? Yes; Fluid consistency: Thin  Diet effective now                  Pt is also offered choice of unit snacks mid-morning and mid-afternoon.  Pt is eating as desired.   Lab results and medications reviewed.   04/17/15, MS, RD, LDN Inpatient Clinical Dietitian Contact information available via Amion

## 2022-01-05 NOTE — BHH Counselor (Addendum)
CSW attempted to contact the Pt's Nephew, Waldo Laine at [phone] on 01/05/2022 at 11:11am.  There was no answer and no voicemail has been set up.  CSW was unable to leave a voicemail asking for a return call. CSW will continue attempts to reach the Nephew and Brother-in-Law.    Update: A second attempt was made on 01/05/2022 at 3:55pm to contact Mr. Waldo Laine.

## 2022-01-05 NOTE — B[facility] Group Notes (Signed)
Patient did not attend the Wrap-Up group. 

## 2022-01-05 NOTE — Progress Notes (Signed)
   01/05/22 0700  Sleep  Number of Hours 5.75

## 2022-01-05 NOTE — [facility] Suicide Risk Assessment (Signed)
[facility] INPATIENT:  Family/Significant Other Suicide Prevention Education  Suicide Prevention Education:  Contact Attempts: Landis Gandy [phone] (Brother-in-Law) has been identified by the patient as the family member/significant other with whom the patient will be residing, and identified as the person(s) who will aid the patient in the event of a mental health crisis.  With written consent from the patient, two attempts were made to provide suicide prevention education, prior to and/or following the patient's discharge.  We were unsuccessful in providing suicide prevention education.  A suicide education pamphlet was given to the patient to share with family/significant other.  Date and time of first attempt: 01/04/2022 at 3:26pm Date and time of second attempt: 01/05/2022 at 11:08am   There is no voicemail set up for this individual at this time.  CSW was unable to leave a voicemail asking for a return call.  CSW will attempt to contact the Pt's Nephew.   Carlos Johnson 01/05/2022, 11:35 AM

## 2022-01-05 NOTE — Progress Notes (Signed)
Pt denies SI/HI/AVH.  Endorses anxiety.  Pt says prn atarax is helpful with anxiety symptoms.  Pt in no acute distress at this time.RN will monitor pt's progress and intervene as needed.

## 2022-01-05 NOTE — Progress Notes (Signed)
[facility] MD Progress Note  01/05/2022 1:57PM Carlos Johnson  MRN:  [id number]  Chief Complaint: psychosis   Reason for Admission:  Carlos Johnson is a 61 y.o. male with reported history of bipolar disorder and polysubstance abuse, who was admitted under IVC to the behavioral health hospital from [facility] ED due to concerns by his sister for recent erratic behaviors.  Apparently the patient had been attempting to walk into oncoming traffic, had not been compliant with his medications, and had been seen talking to unseen others prior to admission.  His UDS on admission was positive for amphetamines. The patient is currently on Hospital Day 3.     Information Obtained Today During Patient Interview: Patient was evaluated and chart was reviewed indicating as needed Atarax was given yesterday morning and again today morning for anxiety, no Ativan needed or given, no as needed Zyprexa.  No need to do given.  Patient was evaluated in his room, he continues to appear disheveled, he presents alert and oriented to self and place seems to be having hard time to organize his thoughts continues to present as disorganized asking about discharge but unable to formulate reliable discharge plan he does report slept well and reports fair appetite he does deny when asking any SI HI or AVH or paranoia.  He denies any other complaints.  He reports compliance with medications and denies side effects .  He reports prior to admission he was living alone in a mobile home, denies any alcohol or illicit drug use prior to admission and unable to explain his positive results for amphetamine and in my opinion secondary to his disorganized thought he is unable.  Principal Problem: Schizophrenia spectrum disorder with psychotic disorder type not yet determined (HCC) Diagnosis: Principal Problem:   Schizophrenia spectrum disorder with psychotic disorder type not yet determined (HCC) Active Problems:   Amphetamine abuse  (HCC)  Total Time Spent in Direct Patient Care:  I personally spent 30 minutes on the unit in direct patient care. The direct patient care time included face-to-face time with the patient, reviewing the patient's chart, communicating with other professionals, and coordinating care. Greater than 50% of this time was spent in counseling or coordinating care with the patient regarding goals of hospitalization, psycho-education, and discharge planning needs.   Past Psychiatric History: see H&P   Past Medical History:  Past Medical History:  Diagnosis Date   Anxiety    Depression    GERD (gastroesophageal reflux disease)    Herpes     Past Surgical History:  Procedure Laterality Date   SHOULDER SURGERY Right 2001   TONSILLECTOMY Bilateral    WISDOM TOOTH EXTRACTION Bilateral    Family History:  Family History  Problem Relation Age of Onset   Asthma Mother    Cancer Father        lung cancer   Hypertension Father    Asthma Daughter    Family Psychiatric  History: see H&P  Social History:  Social History   Substance and Sexual Activity  Alcohol Use Yes   Alcohol/week: 1.0 - 2.0 standard drink   Types: 1 - 2 Cans of beer per week   Comment: occ     Social History   Substance and Sexual Activity  Drug Use Not Currently   Types: Oxycodone, Hydrocodone, Heroin, Nitrous oxide   Comment:  pt denies use 02-08-18    Social History   Socioeconomic History   Marital status: Single    Spouse name: Not  on file   Number of children: 1   Years of education: Not on file   Highest education level: GED or equivalent  Occupational History   Not on file  Tobacco Use   Smoking status: Every Day    Packs/day: 0.50    Years: 36.00    Pack years: 18.00    Types: Cigarettes   Smokeless tobacco: Never  Vaping Use   Vaping Use: Never used  Substance and Sexual Activity   Alcohol use: Yes    Alcohol/week: 1.0 - 2.0 standard drink    Types: 1 - 2 Cans of beer per week    Comment:  occ   Drug use: Not Currently    Types: Oxycodone, Hydrocodone, Heroin, Nitrous oxide    Comment:  pt denies use 02-08-18   Sexual activity: Not Currently  Other Topics Concern   Not on file  Social History Narrative   Not on file   Social Determinants of Health   Financial Resource Strain: Not on file  Food Insecurity: Not on file  Transportation Needs: Not on file  Physical Activity: Not on file  Stress: Not on file  Social Connections: Not on file    Sleep: Good  Appetite:  Good  Current Medications: Current Facility-Administered Medications  Medication Dose Route Frequency Provider Last Rate Last Admin   acetaminophen (TYLENOL) tablet 650 mg  650 mg Oral Q6H PRN Mason Jim, Amy E, MD   650 mg at 01/04/22 1200   aspirin EC tablet 81 mg  81 mg Oral Daily Singleton, Amy E, MD   81 mg at 01/04/22 9562   benztropine (COGENTIN) tablet 0.5 mg  0.5 mg Oral BID PRN Comer Locket, MD       cloNIDine (CATAPRES) tablet 0.1 mg  0.1 mg Oral Q8H PRN Mason Jim, Amy E, MD       diltiazem (DILACOR XR) 24 hr capsule 180 mg  180 mg Oral Daily Bobbitt, Shalon E, NP   180 mg at 01/04/22 0828   hydrOXYzine (ATARAX) tablet 25 mg  25 mg Oral Q6H PRN Comer Locket, MD   25 mg at 01/04/22 0834   ibuprofen (ADVIL) tablet 600 mg  600 mg Oral Q8H PRN Bartholomew Crews E, MD   600 mg at 01/04/22 0906   lidocaine (LIDODERM) 5 % 1 patch  1 patch Transdermal Q24H Bartholomew Crews E, MD   1 patch at 01/03/22 2039   loperamide (IMODIUM) capsule 2-4 mg  2-4 mg Oral PRN Comer Locket, MD       loratadine (CLARITIN) tablet 10 mg  10 mg Oral Daily Ntuen, Tina C, FNP   10 mg at 01/04/22 0829   OLANZapine zydis (ZYPREXA) disintegrating tablet 5 mg  5 mg Oral Q8H PRN Comer Locket, MD       And   LORazepam (ATIVAN) tablet 1 mg  1 mg Oral PRN Mason Jim, Amy E, MD       And   ziprasidone (GEODON) injection 20 mg  20 mg Intramuscular PRN Mason Jim, Amy E, MD       LORazepam (ATIVAN) tablet 1 mg  1 mg Oral Q6H  PRN Comer Locket, MD       multivitamin with minerals tablet 1 tablet  1 tablet Oral Daily Comer Locket, MD   1 tablet at 01/04/22 0829   nicotine polacrilex (NICORETTE) gum 2 mg  2 mg Oral PRN Comer Locket, MD   2 mg at 01/04/22 1204  OLANZapine zydis (ZYPREXA) disintegrating tablet 10 mg  10 mg Oral QHS Mason Jim, Amy E, MD   10 mg at 01/03/22 2037   ondansetron (ZOFRAN-ODT) disintegrating tablet 4 mg  4 mg Oral Q6H PRN Comer Locket, MD       pantoprazole (PROTONIX) EC tablet 40 mg  40 mg Oral Daily Bobbitt, Shalon E, NP   40 mg at 01/04/22 1610   thiamine tablet 100 mg  100 mg Oral Daily Mason Jim, Amy E, MD   100 mg at 01/04/22 9604   traZODone (DESYREL) tablet 50 mg  50 mg Oral QHS PRN Comer Locket, MD       valACYclovir (VALTREX) tablet 1,000 mg  1,000 mg Oral Daily Mason Jim, Amy E, MD   1,000 mg at 01/04/22 5409    Lab Results:  Results for orders placed or performed during the hospital encounter of 01/01/22 (from the past 48 hour(s))  TSH     Status: None   Collection Time: 01/03/22  6:41 AM  Result Value Ref Range   TSH 3.396 0.350 - 4.500 uIU/mL    Comment: Performed by a 3rd Generation assay with a functional sensitivity of <=0.01 uIU/mL. Performed at [facility], [address], [place], [place] [address]   CBC with Differential/Platelet     Status: None   Collection Time: 01/03/22  6:41 AM  Result Value Ref Range   WBC 7.4 4.0 - 10.5 K/uL   RBC 4.70 4.22 - 5.81 MIL/uL   Hemoglobin 14.3 13.0 - 17.0 g/dL   HCT 47.8 29.5 - 62.1 %   MCV 95.1 80.0 - 100.0 fL   MCH 30.4 26.0 - 34.0 pg   MCHC 32.0 30.0 - 36.0 g/dL   RDW 30.8 65.7 - 84.6 %   Platelets 328 150 - 400 K/uL   nRBC 0.0 0.0 - 0.2 %   Neutrophils Relative % 55 %   Neutro Abs 4.1 1.7 - 7.7 K/uL   Lymphocytes Relative 33 %   Lymphs Abs 2.4 0.7 - 4.0 K/uL   Monocytes Relative 9 %   Monocytes Absolute 0.7 0.1 - 1.0 K/uL   Eosinophils Relative 2 %   Eosinophils Absolute 0.2  0.0 - 0.5 K/uL   Basophils Relative 1 %   Basophils Absolute 0.1 0.0 - 0.1 K/uL   Immature Granulocytes 0 %   Abs Immature Granulocytes 0.02 0.00 - 0.07 K/uL    Comment: Performed at [facility], [address] [place], [place] [address]  Basic metabolic panel     Status: Abnormal   Collection Time: 01/03/22  6:41 AM  Result Value Ref Range   Sodium 132 (L) 135 - 145 mmol/L   Potassium 4.5 3.5 - 5.1 mmol/L   Chloride 96 (L) 98 - 111 mmol/L   CO2 26 22 - 32 mmol/L   Glucose, Bld 127 (H) 70 - 99 mg/dL    Comment: Glucose reference range applies only to samples taken after fasting for at least 8 hours.   BUN 16 6 - 20 mg/dL   Creatinine, Ser 2.84 0.61 - 1.24 mg/dL   Calcium 8.9 8.9 - 13.2 mg/dL   GFR, Estimated >44 >01 mL/min    Comment: (NOTE) Calculated using the CKD-EPI Creatinine Equation (2021)    Anion gap 10 5 - 15    Comment: Performed at [facility], [address], [place], [place] [address]  Lipid panel     Status: None   Collection Time:  01/03/22  6:41 AM  Result Value Ref Range   Cholesterol 161 0 - 200 mg/dL   Triglycerides 56 <664 mg/dL   HDL 62 >40 mg/dL   Total CHOL/HDL Ratio 2.6 RATIO   VLDL 11 0 - 40 mg/dL   LDL Cholesterol 88 0 - 99 mg/dL    Comment:        Total Cholesterol/HDL:CHD Risk Coronary Heart Disease Risk Table                     Men   Women  1/2 Average Risk   3.4   3.3  Average Risk       5.0   4.4  2 X Average Risk   9.6   7.1  3 X Average Risk  23.4   11.0        Use the calculated Patient Ratio above and the CHD Risk Table to determine the patient's CHD Risk.        ATP III CLASSIFICATION (LDL):  <100     mg/dL   Optimal  347-425  mg/dL   Near or Above                    Optimal  130-159  mg/dL   Borderline  956-387  mg/dL   High  >564     mg/dL   Very High Performed at [facility], [address] [place], [place] [address]   Hemoglobin A1c     Status: None    Collection Time: 01/03/22  6:41 AM  Result Value Ref Range   Hgb A1c MFr Bld 5.4 4.8 - 5.6 %    Comment: (NOTE) Pre diabetes:          5.7%-6.4%  Diabetes:              >6.4%  Glycemic control for   <7.0% adults with diabetes    Mean Plasma Glucose 108.28 mg/dL    Comment: Performed at [facility], [address] [place], [place] [address]  Basic metabolic panel     Status: Abnormal   Collection Time: 01/04/22  6:28 AM  Result Value Ref Range   Sodium 137 135 - 145 mmol/L   Potassium 4.2 3.5 - 5.1 mmol/L   Chloride 104 98 - 111 mmol/L   CO2 26 22 - 32 mmol/L   Glucose, Bld 134 (H) 70 - 99 mg/dL    Comment: Glucose reference range applies only to samples taken after fasting for at least 8 hours.   BUN 19 6 - 20 mg/dL   Creatinine, Ser 6.60 0.61 - 1.24 mg/dL   Calcium 8.9 8.9 - 63.0 mg/dL   GFR, Estimated >16 >01 mL/min    Comment: (NOTE) Calculated using the CKD-EPI Creatinine Equation (2021)    Anion gap 7 5 - 15    Comment: Performed at [facility], [address], [place], [place] [address]    Blood Alcohol level:  Lab Results  Component Value Date   ETH <10 12/30/2021   ETH <10 09/25/2021    Metabolic Disorder Labs: Lab Results  Component Value Date   HGBA1C 5.4 01/03/2022   MPG 108.28 01/03/2022   No results found for: PROLACTIN Lab Results  Component Value Date   CHOL 161 01/03/2022   TRIG 56 01/03/2022   HDL 62 01/03/2022   CHOLHDL 2.6 01/03/2022   VLDL 11 01/03/2022   LDLCALC 88 01/03/2022   LDLCALC  106 (H) 09/26/2021    Physical Findings: CIWA:  CIWA-Ar Total: 4   Musculoskeletal: Strength & Muscle Tone: within normal limits Gait & Station: normal Patient leans: Front  Psychiatric Specialty Exam:  Presentation  General Appearance:  unkempt, appears older than stated age  Eye Contact:Brief  Speech:mumbling quality, normal fluency and rate  Speech Volume:Normal  Mood and Affect  Mood:described as "good" -  appears ambivalent and at times irritable  Affect: constricted, at times irritable   Thought Process  Thought Processes: tangential and at times disorganized  Orientation:oriented to self, month, year and city but not situation  Thought Content: Denies AVH, paranoia, ideas of reference, first rank symptoms, SI or HI and is not grossly responding to internal/external stimuli on exam  Hallucinations:Denied  Ideas of Reference:None  Suicidal Thoughts:Suicidal Thoughts: No  Homicidal Thoughts:Homicidal Thoughts: No   Sensorium  Memory:Poor  Judgment:Fair - taking meds on the unit  Insight:Poor   Executive Functions  Concentration:Poor  Attention Span:Poor  Recall:Untested  Fund of Knowledge:Fair  Language:Fair   Psychomotor Activity  Psychomotor Activity:Fidgety - abruptly leaves interview to go to restroom at one point   Assets  Assets:Resilience   Sleep  4.45 hours   Physical Exam Vitals and nursing note reviewed.  HENT:     Head: Normocephalic.  Pulmonary:     Effort: Pulmonary effort is normal.  Skin:    Comments: Herpetic lesions on buttock are scabbing over - no signs of secondary infection  Neurological:     Mental Status: He is alert.   Review of Systems  Respiratory:  Negative for shortness of breath.   Cardiovascular:  Negative for chest pain.  Gastrointestinal:  Negative for abdominal pain, constipation, diarrhea, nausea and vomiting.  Musculoskeletal:  Positive for back pain.  Blood pressure (!) 145/97, pulse 99, temperature 98 F (36.7 C), temperature source Oral, resp. rate 16, height 5\' 7"  (1.702 m), weight 71.3 kg, SpO2 99 %. Body mass index is 24.62 kg/m.   Treatment Plan Summary: Unspecified schizophrenia spectrum and other psychotic d/o (r/o SIPD, r/o schizoaffective d/o, r/o bipolar mania MRE severe with psychotic features) Stimulant use d/o - amphetamine type R/o alcohol use d/o H/o CVA Genital herpes Tick  bite HTN Chronic low back pain with h/o L1 compression fracture   PLAN: Safety and Monitoring:             -- Involuntary admission to inpatient psychiatric unit for safety, stabilization and treatment             -- Daily contact with patient to assess and evaluate symptoms and progress in treatment             -- Patient's case to be discussed in multi-disciplinary team meeting             -- Observation Level : q15 minute checks             -- Vital signs:  q12 hours             -- Precautions: suicide, elopement, and assault   2. Psychiatric Diagnoses and Treatment:              Unspecified schizophrenia spectrum and other psychotic d/o (r/o SIPD, r/o schizoaffective d/o, r/o bipolar mania MRE severe with psychotic features)             -- Continue Zyprexa zydis 10mg   qhs tonight and monitor for symptom improvement as he metabolizes amphetamines in his system Lipid Panel: WNL other  than LDL 106; HbgA1c: 5.4; QTc:414ms             -- Cogentin 0.5mg  PRN EPS             -- Vistaril  q6 hours PRN anxiety             -- Trazodone  and will schedule tonight for help with sleep             -- Agitation protocol zyprexa/ativan/Geodon             -- Attempting to get collateral from sister Darl Pikes [phone]                 -- Encouraged patient to participate in unit milieu and in scheduled group therapies                           Stimulant use d/o - amphetamine type             R/o alcohol use d/o             -- will need meaningful discussion about need to abstain from alcohol, illicit drug and prescription drug abuse as he clears             -- Since he is an unreliable historian and has h/o alcohol abuse will place on CIWA for monitoring with MVI oral replacement (recent scores 1,4)     3. Medical Issues Being Addressed:              HTN             -- Restarted home Diltiazem  daily              -- Hold Lasix due to drop in Na+ to 132             -- Clonidine 0.1mg   PRN SBP >160 or DBP >110   -- Have consulted hospitalist for additional BP med recommendations               Chronic back pain             -- Continue Lidoderm patch              -- No opiates given addiction history (Per PDMP has not filled opiates since 9/22)             -- Patient clarified having no allergy to Tylenol or Motrin and meds ordered PRN               H/o CVA             -- Restarting ASA  daily               Tick Bite             -- NP spoke to ID who recommends no antibiotic at this time given afebrile             - RMSF pending and lyme panel negative               Genital Herpes             -- NP spoke to ID who recommended restart of Valtrex             -- Restarted Valtrex  daily for 5 days and then reassess need for ongoing daily prophylaxis  Leukocytosis - resolved             -- Repeat CBC down to 7.4             -- Afebrile               Mild hyponatremia - resolved             -- Holding lasix and monitoring BMP - repeat Na+ today 137 after Lasix held               Tobacco Use d/o             -- Nicotine gum PRN              Congestion -Initiate Claritin 10 mg po daily   4. Discharge Planning:              -- Social work and case management to assist with discharge planning and identification of hospital follow-up needs prior to discharge             -- Estimated LOS: 5-7 days             -- Discharge Concerns: Need to establish a safety plan; Medication compliance and effectiveness             -- Discharge Goals: Return home with outpatient referrals for mental health follow-up including medication management/psychotherapy  Comer Locket, MD, FAPA 01/04/2022, 2:53 PM

## 2022-01-05 NOTE — Progress Notes (Signed)
   01/05/22 0100  Psych Admission Type (Psych Patients Only)  Admission Status Involuntary  Psychosocial Assessment  Patient Complaints Anxiety;Depression  Eye Contact Fair  Facial Expression Anxious  Affect Anxious  Speech Logical/coherent  Interaction Assertive  Motor Activity Slow  Appearance/Hygiene Disheveled  Behavior Characteristics Cooperative  Mood Anxious  Thought Chartered certified accountant of ideas  Content Preoccupation  Delusions None reported or observed  Perception WDL  Hallucination None reported or observed  Judgment Poor  Danger to Self  Current suicidal ideation? Denies  Danger to Others  Danger to Others None reported or observed

## 2022-01-05 NOTE — Progress Notes (Signed)
  PROGRESS NOTE  TRH consulted for BP management.  Patient is currently on diltiazem 180 mg daily, losartan 25 mg daily.  BP this morning is much better controlled 129/84.  Heart rate ranges 90-101.  BMP with creatinine 0.66, potassium 4.2.  Would continue current regimen at dosing as above.  No change today.  TRH service will follow and monitor BP peripherally and make medication changes as necessary.    Noralee Stain, DO [facility] 01/05/2022, 10:01 AM  Available via Epic secure chat 7am-7pm After these hours, please refer to coverage provider listed on [email or web address]

## 2022-01-05 NOTE — Group Note (Signed)
Recreation Therapy Group Note   Group Topic:Animal Assisted Therapy   Group Date: 01/05/2022 Start Time: 1430 End Time: 1515 Facilitators: Caroll Rancher, LRT,CTRS Location: [address]   AAA/T Program Assumption of Risk Form signed by Patient/ or Parent Legal Guardian Yes  Patient is free of allergies or severe asthma Yes  Patient reports no fear of animals Yes  Patient reports no history of cruelty to animals Yes  Patient understands his/her participation is voluntary Yes  Patient washes hands before animal contact Yes  Patient washes hands after animal contact Yes   Affect/Mood: Appropriate   Participation Level: Engaged    Clinical Observations/Individualized Feedback:  Patient attended session and interacted appropriately with therapy dog and peers. Patient asked appropriate questions about therapy dog and his training. Patient shared stories about their pets at home with group.     Plan: Continue to engage patient in RT group sessions 2-3x/week.   Caroll Rancher, LRT,CTRS 01/05/2022 4:04 PM

## 2022-01-05 NOTE — BHH Counselor (Signed)
CSW provided the Pt with a packet that contains information including shelter and housing resources, free and reduced price food information, clothing resources, crisis center information, a GoodRX card, and suicide prevention information.   

## 2022-01-06 MED ORDER — OLANZAPINE 5 MG PO TBDP
5.0000 mg | ORAL_TABLET | Freq: Every day | ORAL | Status: DC
  Administered 2022-01-06 – 2022-01-08 (×3): 5 mg via ORAL
  Filled 2022-01-06 (×5): qty 1

## 2022-01-06 MED ORDER — HYDROXYZINE HCL 25 MG PO TABS
25.0000 mg | ORAL_TABLET | Freq: Two times a day (BID) | ORAL | Status: DC
Start: 2022-01-06 — End: 2022-01-08
  Administered 2022-01-06 – 2022-01-08 (×4): 25 mg via ORAL
  Filled 2022-01-06 (×7): qty 1

## 2022-01-06 MED ORDER — HYDROXYZINE HCL 25 MG PO TABS
25.0000 mg | ORAL_TABLET | Freq: Four times a day (QID) | ORAL | Status: DC | PRN
  Administered 2022-01-06 – 2022-01-07 (×3): 25 mg via ORAL
  Filled 2022-01-06 (×3): qty 1

## 2022-01-06 NOTE — Progress Notes (Signed)
     01/06/22 2030  Psych Admission Type (Psych Patients Only)  Admission Status Involuntary  Psychosocial Assessment  Patient Complaints Anxiety  Eye Contact Brief  Facial Expression Anxious  Affect Anxious  Speech Logical/coherent  Interaction Assertive;Needy  Motor Activity Slow  Appearance/Hygiene Disheveled  Behavior Characteristics Cooperative  Mood Anxious  Thought Process  Coherency Circumstantial  Content Preoccupation  Delusions None reported or observed  Perception WDL  Hallucination None reported or observed  Judgment Impaired  Confusion WDL  Danger to Self  Current suicidal ideation? Denies  Danger to Others  Danger to Others None reported or observed

## 2022-01-06 NOTE — Group Note (Signed)
Recreation Therapy Group Note   Group Topic:Team Building  Group Date: 01/06/2022 Start Time: 0930 End Time: 0956 Facilitators: Caroll Rancher, LRT,CTRS Location: [address]   Goal Area(s) Addresses:  Patient will effectively work with peer towards shared goal.  Patient will identify skills used to make activity successful.  Patient will identify how skills used during activity can be applied to reach post d/c goals.   Group Description: [employer]. In teams of 3-4, patients were given 25 small craft pipe cleaners. Using the materials provided, patients were instructed to compete again the opposing team(s) to build the tallest free-standing structure from floor level. The activity was timed; difficulty increased by Clinical research associate as Production designer, theatre/television/film continued.  Systematically resources were removed with additional directions for example, placing one arm behind their back, working in silence, and shape stipulations. LRT facilitated post-activity discussion reviewing team processes and necessary communication skills involved in completion. Patients were encouraged to reflect how the skills utilized, or not utilized, in this activity can be incorporated to positively impact support systems post discharge.   Affect/Mood: Anxious   Participation Level: Hyperverbal and None   Participation Quality: None   Behavior: Distracted and Hyperverbal   Speech/Thought Process: Distracted and Delusional   Insight: Lacking   Judgement: Lacking    Modes of Intervention: Activity   Patient Response to Interventions:  Observant   Education Outcome:  In group clarification offered    Clinical Observations/Individualized Feedback: Pt did not participate in activity.  Pt had a hard time comprehending what the activity was.  Pt seemed distracted because nothing he mentioned had anything to do with the activity.      Plan: Continue to engage patient in RT group sessions  2-3x/week.   Caroll Rancher A, NT,  01/06/2022 12:36 PM

## 2022-01-06 NOTE — Progress Notes (Signed)
Pt denies SI/HI/AVH.  Pt is pleasant and appears to be in no acute distress.  Pt took medications without incident and no adverse reactions were noted.  Pt has red, itchy skin area on his left arm.  MD notified.  RN will continue to monitor pt's progress and provide assistance as needed.

## 2022-01-06 NOTE — Group Note (Signed)
LCSW Group Therapy Note   Group Date: 01/06/2022 Start Time: 1300 End Time: 1400   Type of Therapy and Topic:  Group Therapy:  Strengths Exploration   Participation Level: Minimal  Description of Group: This group allows individuals to explore their strengths, learn to use strengths in new ways to improve well-being. Strengths-based interventions involve identifying strengths, understanding how they are used, and learning new ways to apply them. Individuals will identify their strengths, and then explore their roles in different areas of life (relationships, professional life, and personal fulfillment). Individuals will think about ways in which they currently use their strengths, along with new ways they could begin using them.    Therapeutic Goals Patient will verbalize two of their strengths Patient will identify how their strengths are currently used Patient will identify two new ways to apply their strengths  Patients will create a plan to apply their strengths in their daily lives     Summary of Patient Progress:  The Pt attended group and remained there the entire time.  The Pt accepted the worksheets and was minimal in their participation.  The Pt was unable to remain on topic and appeared to have delusional thinking throughout the group. The Pt faced the wall or away from the group and would often talk to himself or laugh at inappropriate times.    Therapeutic Modalities Cognitive Behavioral Therapy Motivational Interviewing  Aram Beecham, [place] 01/06/2022  1:58 PM

## 2022-01-06 NOTE — Progress Notes (Signed)
   01/06/22 0500  Psych Admission Type (Psych Patients Only)  Admission Status Involuntary  Psychosocial Assessment  Patient Complaints Anxiety  Eye Contact Brief  Facial Expression Anxious  Affect Anxious  Speech Logical/coherent  Interaction Assertive  Motor Activity Slow  Appearance/Hygiene Disheveled  Behavior Characteristics Cooperative  Mood Anxious  Thought Process  Coherency Circumstantial  Content Preoccupation  Delusions None reported or observed  Perception WDL  Hallucination None reported or observed  Judgment Poor  Confusion WDL  Danger to Self  Current suicidal ideation? Denies  Danger to Others  Danger to Others None reported or observed

## 2022-01-06 NOTE — Progress Notes (Addendum)
Carlos Johnson  01/06/2022 12:18 PM Carlos Johnson  MRN:  [id number] Subjective:    Carlos Johnson is a 61 y.o. male with reported history of bipolar disorder and polysubstance abuse, who was admitted under IVC to the behavioral health hospital from [facility] ED due to concerns by his sister for recent erratic behaviors.  Apparently the patient had been attempting to walk into oncoming traffic, had not been compliant with his medications, and had been seen talking to unseen others prior to admission.   The psychiatric team made the following recommendations yesterday:             -- Continue Zyprexa zydis 10mg   qhs tonight and monitor for symptom improvement as he metabolizes amphetamines in his system             -- Cogentin 0.5mg  PRN EPS             -- Vistaril 25mg  q6 hours PRN anxiety             -- Trazodone 50mg  and will schedule tonight for help with sleep  Patient reports that his mood is good today and that he was able to sleep more than usual and shower this morning. He reports that his appetite has been good as well. He feels that the medications are helping him as he reports having a clearer mind that previously with no side effects of his medication. 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   Although he reports having clearer thoughts, he still lacks insight to know why he was IVC'd and believes that he is here as we will be helping him with housing. Previously he reports living in a mobile home that his brother in law owns. He is still very distracted and hard to understand with loose associations. 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 His only concern for today was when he could get a morning snack. He has been attending group and finds it helpful to socialize in the day room.  He  denies all AVH, HI, and SI, but when asked about AH he responds that he can hear [place] but that is all.   Into afternoon I was notified patient complains of allergy he has left arm, I went to his room to examine him he seems to have redness and rash around the area where he has his arm band but extended above that area as well, he does complain of it itching, discussed with staff transferring arm band to his other arm and will start Atarax twice daily scheduled for itching and he has hydroxyzine available as needed, will follow. I was also notified by social worker present during group patient was noted to be disorganized and disruptive not making sense in his speech, please Johnson this was not noticed this morning during evaluation except for having derailed conversation at times, when I went to evaluate him this afternoon he asks about his discharge plan and tells me that he wanted to arrange for transportation and he will call a friend to pick him up when the discharge, he knows he has no place to go and when I informed him that we will give him shelter information he responded in a linear manner noting he is in agreement to  have his friend pick him up to a shelter.  Due to reported disorganized thought process during group I will add Zyprexa during daytime 5 mg Zydis and monitor tomorrow.  Principal Problem: Schizophrenia spectrum disorder with psychotic disorder type not yet determined (Hiko) Diagnosis: Principal Problem:   Schizophrenia spectrum disorder with psychotic disorder type not yet determined ([place]) Active Problems:   Amphetamine abuse ([place])   HTN (hypertension)  Total Time spent with patient: 15 minutes  Past Psychiatric History: See H&P  Past Medical History:  Past Medical History:  Diagnosis Date   Anxiety    Depression    GERD (gastroesophageal reflux disease)    Herpes     Past Surgical History:  Procedure Laterality Date   SHOULDER SURGERY Right 2001   TONSILLECTOMY Bilateral     WISDOM TOOTH EXTRACTION Bilateral    Family History:  Family History  Problem Relation Age of Onset   Asthma Mother    Cancer Father        lung cancer   Hypertension Father    Asthma Daughter    Family Psychiatric  History: See H&P Social History:  Social History   Substance and Sexual Activity  Alcohol Use Yes   Alcohol/week: 1.0 - 2.0 standard drink   Types: 1 - 2 Cans of beer per week   Comment: occ     Social History   Substance and Sexual Activity  Drug Use Not Currently   Types: Oxycodone, Hydrocodone, Heroin, Nitrous oxide   Comment:  pt denies use 02-08-18    Social History   Socioeconomic History   Marital status: Single    Spouse name: Not on file   Number of children: 1   Years of education: Not on file   Highest education level: GED or equivalent  Occupational History   Not on file  Tobacco Use   Smoking status: Every Day    Packs/day: 0.50    Years: 36.00    Pack years: 18.00    Types: Cigarettes   Smokeless tobacco: Never  Vaping Use   Vaping Use: Never used  Substance and Sexual Activity   Alcohol use: Yes    Alcohol/week: 1.0 - 2.0 standard drink    Types: 1 - 2 Cans of beer per week    Comment: occ   Drug use: Not Currently    Types: Oxycodone, Hydrocodone, Heroin, Nitrous oxide    Comment:  pt denies use 02-08-18   Sexual activity: Not Currently  Other Topics Concern   Not on file  Social History Narrative   Not on file   Social Determinants of Health   Financial Resource Strain: Not on file  Food Insecurity: Not on file  Transportation Needs: Not on file  Physical Activity: Not on file  Stress: Not on file  Social Connections: Not on file   Additional Social History:    Sleep: Good  Appetite:  Good  Current Medications: Current Facility-Administered Medications  Medication Dose Route Frequency Provider Last Rate Last Admin   acetaminophen (TYLENOL) tablet 650 mg  650 mg Oral Q6H PRN Nelda Marseille, Amy E, MD   650 mg at  01/04/22 1200   aspirin EC tablet 81 mg  81 mg Oral Daily Singleton, Amy E, MD   81 mg at 01/06/22 0759   benztropine (COGENTIN) tablet 0.5 mg  0.5 mg Oral BID PRN Harlow Asa, MD       cloNIDine (CATAPRES) tablet 0.1 mg  0.1 mg Oral Q8H PRN Nelda Marseille,  Amy E, MD       diltiazem (DILACOR XR) 24 hr capsule 180 mg  180 mg Oral Daily Bobbitt, Shalon E, NP   180 mg at 01/06/22 0758   ibuprofen (ADVIL) tablet 600 mg  600 mg Oral Q8H PRN Comer Locket, MD   600 mg at 01/06/22 0756   lidocaine (LIDODERM) 5 % 1 patch  1 patch Transdermal Q24H Comer Locket, MD   1 patch at 01/05/22 2010   loratadine (CLARITIN) tablet 10 mg  10 mg Oral Daily Ntuen, Jesusita Oka, FNP   10 mg at 01/06/22 0759   OLANZapine zydis (ZYPREXA) disintegrating tablet 5 mg  5 mg Oral Q8H PRN Comer Locket, MD       And   LORazepam (ATIVAN) tablet 1 mg  1 mg Oral PRN Comer Locket, MD       And   ziprasidone (GEODON) injection 20 mg  20 mg Intramuscular PRN Comer Locket, MD       losartan (COZAAR) tablet 25 mg  25 mg Oral Daily Mason Jim, Amy E, MD   25 mg at 01/06/22 5053   multivitamin with minerals tablet 1 tablet  1 tablet Oral Daily Comer Locket, MD   1 tablet at 01/06/22 9767   nicotine polacrilex (NICORETTE) gum 2 mg  2 mg Oral PRN Comer Locket, MD   2 mg at 01/06/22 0645   OLANZapine zydis (ZYPREXA) disintegrating tablet 10 mg  10 mg Oral QHS Comer Locket, MD   10 mg at 01/05/22 2115   pantoprazole (PROTONIX) EC tablet 40 mg  40 mg Oral Daily Bobbitt, Shalon E, NP   40 mg at 01/06/22 0759   thiamine tablet 100 mg  100 mg Oral Daily Comer Locket, MD   100 mg at 01/06/22 0758   traZODone (DESYREL) tablet 50 mg  50 mg Oral QHS PRN Comer Locket, MD       valACYclovir (VALTREX) tablet 1,000 mg  1,000 mg Oral Daily Comer Locket, MD   1,000 mg at 01/06/22 3419    Lab Results:  Results for orders placed or performed during the hospital encounter of 01/01/22 (from the past 48 hour(s))  Renal  function panel     Status: Abnormal   Collection Time: 01/05/22  6:28 AM  Result Value Ref Range   Sodium 137 135 - 145 mmol/L   Potassium 4.2 3.5 - 5.1 mmol/L   Chloride 103 98 - 111 mmol/L   CO2 28 22 - 32 mmol/L   Glucose, Bld 130 (H) 70 - 99 mg/dL    Comment: Glucose reference range applies only to samples taken after fasting for at least 8 hours.   BUN 17 6 - 20 mg/dL   Creatinine, Ser 3.79 0.61 - 1.24 mg/dL   Calcium 9.0 8.9 - 02.4 mg/dL   Phosphorus 3.6 2.5 - 4.6 mg/dL   Albumin 3.5 3.5 - 5.0 g/dL   GFR, Estimated >09 >73 mL/min    Comment: (Johnson) Calculated using the CKD-EPI Creatinine Equation (2021)    Anion gap 6 5 - 15    Comment: Performed at [facility], [address], [place], [place] [address]    Blood Alcohol level:  Lab Results  Component Value Date   [facility] <10 12/30/2021   ETH <10 09/25/2021    Metabolic Disorder Labs: Lab Results  Component Value Date   HGBA1C 5.4 01/03/2022   MPG 108.28 01/03/2022  No results found for: PROLACTIN Lab Results  Component Value Date   CHOL 161 01/03/2022   TRIG 56 01/03/2022   HDL 62 01/03/2022   CHOLHDL 2.6 01/03/2022   VLDL 11 01/03/2022   LDLCALC 88 01/03/2022   LDLCALC 106 (H) 09/26/2021    Physical Findings: AIMS:  , ,  ,  ,    CIWA:  CIWA-Ar Total: 0 COWS:     Musculoskeletal: Strength & Muscle Tone: within normal limits Gait & Station:  slow gait Patient leans: N/A  Psychiatric Specialty Exam:  Presentation  General Appearance: Appropriate for Environment; Casual  Eye Contact:Minimal  Speech:-- (speaks quickly and hard to understand)  Speech Volume:Normal  Handedness:Right   Mood and Affect  Mood:Anxious; Euthymic  Affect:Congruent   Thought Process  Thought Processes:Goal Directed  Descriptions of Associations:Tangential  Orientation:Full (Time, Place and Person)  Thought Content:Tangential  History of Schizophrenia/Schizoaffective  disorder:Yes  Duration of Psychotic Symptoms:No data recorded Hallucinations:Hallucinations: None Description of Auditory Hallucinations: None endorsed at this time  Ideas of Reference:None  Suicidal Thoughts:Suicidal Thoughts: No  Homicidal Thoughts:Homicidal Thoughts: No   Sensorium  Memory:Immediate Fair; Recent Fair; Remote Fair  Judgment:-- (limited as yesterday)  Insight:Lacking   Executive Functions  Concentration:Fair  Attention Span:Fair  [place]   Psychomotor Activity  Psychomotor Activity:Psychomotor Activity: Normal   Assets  Assets:Physical Health; Resilience   Sleep  Sleep:Sleep: Good    Physical Exam: Physical Exam Vitals and nursing Johnson reviewed.  HENT:     Head: Normocephalic.  Pulmonary:     Effort: Pulmonary effort is normal.  Neurological:     General: No focal deficit present.     Mental Status: He is alert.   Review of Systems  Gastrointestinal:  Negative for abdominal pain, constipation, diarrhea, nausea and vomiting.  Neurological:  Negative for dizziness and headaches.  Psychiatric/Behavioral:  Negative for depression and suicidal ideas. The patient is nervous/anxious. The patient does not have insomnia.   Blood pressure 118/78, pulse 96, temperature 97.8 F (36.6 C), temperature source Oral, resp. rate 17, height 5\' 7"  (1.702 m), weight 71.3 kg, SpO2 100 %. Body mass index is 24.62 kg/m.   Treatment Plan Summary: Unspecified schizophrenia spectrum and other psychotic d/o (r/o SIPD, r/o schizoaffective d/o, r/o bipolar mania MRE severe with psychotic features) Stimulant use d/o - amphetamine type R/o alcohol use d/o H/o CVA Genital herpes Tick bite HTN Chronic low back pain with h/o L1 compression fracture   PLAN: Safety and Monitoring:             -- Involuntary admission to inpatient psychiatric unit for safety, stabilization and treatment             -- Daily contact  with patient to assess and evaluate symptoms and progress in treatment             -- Patient's case to be discussed in multi-disciplinary team meeting             -- Observation Level : q15 minute checks             -- Vital signs:  q12 hours             -- Precautions: suicide, elopement, and assault   2. Psychiatric Diagnoses and Treatment:              Unspecified schizophrenia spectrum and other psychotic d/o (r/o SIPD, r/o schizoaffective d/o, r/o bipolar mania MRE severe with psychotic features)             --  Continue Zyprexa zydis 10mg   qhs tonight and monitor for symptom improvement as he metabolizes amphetamines in his system Lipid Panel: WNL other than LDL 106; HbgA1c: 5.4; QTc:414ms             -- Cogentin 0.5mg  PRN EPS             -- Vistaril 25mg  q6 hours PRN anxiety             -- Trazodone 50mg  and will schedule tonight for help with sleep             -- Agitation protocol zyprexa/ativan/Geodon             -- Attempting to get collateral from sister Manuela Schwartz [phone]                 -- Encouraged patient to participate in unit milieu and in scheduled group therapies                           Stimulant use d/o - amphetamine type             R/o alcohol use d/o             -- patient was counseled and he agreed to comply with recommendation to abstain from unprescribed meds or illicit drug use after dc     3. Medical Issues Being Addressed:              HTN             -- Continue home Diltiazem 180mg  daily              -- Hold Lasix due to drop in Na+ to 132             -- Clonidine 0.1mg  PRN SBP >160 or DBP >110              -- Have consulted hospitalist for additional BP med recommendations               Chronic back pain             -- Continue Lidoderm patch              -- No opiates given addiction history (Per PDMP has not filled opiates since 9/22)             -- Patient clarified having no allergy to Tylenol or Motrin and meds ordered PRN               H/o  CVA             -- Continue ASA 81mg  daily               Tick Bite             -- NP spoke to ID who recommends no antibiotic at this time given afebrile             - RMSF pending and lyme panel negative               Genital Herpes             -- NP spoke to ID who recommended restart of Valtrex             -- Restarted Valtrex 1000mg  daily for 5 days and then reassess need for ongoing daily prophylaxis  Leukocytosis - resolved             -- Repeat CBC down to 7.4             -- Afebrile               Mild hyponatremia - resolved             -- Holding lasix and monitoring BMP - repeat Na+ today 137 after Lasix held               Tobacco Use d/o             -- Nicotine gum PRN              Congestion -Initiate Claritin 10 mg po daily   4. Discharge Planning:              -- Social work and case management to assist with discharge planning and identification of hospital follow-up needs prior to discharge             -- Estimated LOS: 5-7 days             -- Discharge Concerns: Need to establish a safety plan; Medication compliance and effectiveness             -- Discharge Goals: Return home with outpatient referrals for mental health follow-up including medication management/psychotherapy  Amelia Jo, Medical Student 01/06/2022, 12:18 PM   I personally was present and performed or re-performed the history, physical exam and medical decision-making activities of this service and have verified that the service and findings are accurately documented in the student's Johnson, , as addended by me or notated below: Patient presents linear and organized in general yet occasionally derails and goes on tangent but easily redirectable, remains with limited insight to why he was admitted but agrees to comply with meds/Fu appt and recommendation to abstain from using street drugs after dc. D/w patient probable dc tomorrow to shelter if cont to improve.   Billey Wojciak Winfred Leeds,  MD Psychiatrist

## 2022-01-06 NOTE — Plan of Care (Signed)
°  Problem: Education: °Goal: Emotional status will improve °Outcome: Progressing °Goal: Mental status will improve °Outcome: Progressing °Goal: Verbalization of understanding the information provided will improve °Outcome: Progressing °  °

## 2022-01-06 NOTE — [facility] Group Notes (Signed)
Pt did not attend psychoeducational group. 

## 2022-01-07 NOTE — Progress Notes (Addendum)
[facility] MD Progress Note  01/07/2022 9:41 AM Ebb Davidheiser  MRN:  [id number] Subjective:    Carlos Johnson is a 61 y.o. male with reported history of bipolar disorder and polysubstance abuse, who was admitted under IVC to the behavioral health hospital from [facility] ED due to concerns by his sister for recent erratic behaviors.  Apparently the patient had been attempting to walk into oncoming traffic, had not been compliant with his medications, and had been seen talking to unseen others prior to admission.   The psychiatric team made the following recommendations yesterday:             -- Continue Zyprexa zydis 10mg   qhs tonight and monitor for symptom improvement as he metabolizes amphetamines in his system  -- Additional Zyprexa during daytime 5mg              -- Cogentin 0.5mg  PRN EPS             -- Vistaril 25mg  q6 hours PRN anxiety             -- Trazodone 50mg  and will schedule tonight for help with sleep  -- Atarax scheduled twice daily for itching   Patient reports that his mood is "pretty good" this morning but that he is anxious for discharge. He is still disorganized but easily redirectable. He is still focused on housing after discharge, and has identified his ex-girlfriend might be able to come get him, but I reiterated our conversations from before. He states he does not want to go to a house or facility that will not let him drink his 2x beers a week. He was also focused on bills and coins at random points throughout the interview similarly to yesterday as currency seems to be a big interest of his. He endorses having no issue with money because he "has plenty of gold and silver."   He continues to endorse that his medications are helping him and that he is not having any side effects or physical symptoms. When asked about medication side effects he stated that his "meds were alright until he can get to his pain clinic and he's not on probation." He continues to find group  helpful. He slept 5-6 hours last night which he reports is his baseline today and that his appetite has been good. He denies all HI/SI/AVH. He requests an insurance card, finger nail clippers, and a way to cut his hair on interview today. He requested I call his attorney to tell him he is safe, but he could not provide a name or number.  Per chart review and nurse report, he was very disruptive during group yesterday. Throughout the morning he is disorganized and restless around the hall. Due to the norovirus outbreak, there are no formal groups today so his behavior cannot be evaluated for discharge at this time.   Principal Problem: Schizophrenia spectrum disorder with psychotic disorder type not yet determined ([place]) Diagnosis: Principal Problem:   Schizophrenia spectrum disorder with psychotic disorder type not yet determined ([place]) Active Problems:   Amphetamine abuse ([place])   HTN (hypertension)  Total Time spent with patient: 15 minutes  Past Psychiatric History: See H&P  Past Medical History:  Past Medical History:  Diagnosis Date   Anxiety    Depression    GERD (gastroesophageal reflux disease)    Herpes     Past Surgical History:  Procedure Laterality Date   SHOULDER SURGERY Right 2001   TONSILLECTOMY Bilateral  WISDOM TOOTH EXTRACTION Bilateral    Family History:  Family History  Problem Relation Age of Onset   Asthma Mother    Cancer Father        lung cancer   Hypertension Father    Asthma Daughter    Family Psychiatric  History: See H&P Social History:  Social History   Substance and Sexual Activity  Alcohol Use Yes   Alcohol/week: 1.0 - 2.0 standard drink   Types: 1 - 2 Cans of beer per week   Comment: occ     Social History   Substance and Sexual Activity  Drug Use Not Currently   Types: Oxycodone, Hydrocodone, Heroin, Nitrous oxide   Comment:  pt denies use 02-08-18    Social History   Socioeconomic History   Marital status: Single    Spouse  name: Not on file   Number of children: 1   Years of education: Not on file   Highest education level: GED or equivalent  Occupational History   Not on file  Tobacco Use   Smoking status: Every Day    Packs/day: 0.50    Years: 36.00    Pack years: 18.00    Types: Cigarettes   Smokeless tobacco: Never  Vaping Use   Vaping Use: Never used  Substance and Sexual Activity   Alcohol use: Yes    Alcohol/week: 1.0 - 2.0 standard drink    Types: 1 - 2 Cans of beer per week    Comment: occ   Drug use: Not Currently    Types: Oxycodone, Hydrocodone, Heroin, Nitrous oxide    Comment:  pt denies use 02-08-18   Sexual activity: Not Currently  Other Topics Concern   Not on file  Social History Narrative   Not on file   Social Determinants of Health   Financial Resource Strain: Not on file  Food Insecurity: Not on file  Transportation Needs: Not on file  Physical Activity: Not on file  Stress: Not on file  Social Connections: Not on file   Additional Social History:    Sleep: Good  Appetite:  Good  Current Medications: Current Facility-Administered Medications  Medication Dose Route Frequency Provider Last Rate Last Admin   acetaminophen (TYLENOL) tablet 650 mg  650 mg Oral Q6H PRN Nelda Marseille, Amy E, MD   650 mg at 01/04/22 1200   aspirin EC tablet 81 mg  81 mg Oral Daily Singleton, Amy E, MD   81 mg at 01/07/22 0825   benztropine (COGENTIN) tablet 0.5 mg  0.5 mg Oral BID PRN Harlow Asa, MD       cloNIDine (CATAPRES) tablet 0.1 mg  0.1 mg Oral Q8H PRN Nelda Marseille, Amy E, MD       diltiazem (DILACOR XR) 24 hr capsule 180 mg  180 mg Oral Daily Bobbitt, Shalon E, NP   180 mg at 01/07/22 0825   hydrOXYzine (ATARAX) tablet 25 mg  25 mg Oral BID Winfred Leeds, Gumecindo Hopkin, MD   25 mg at 01/07/22 0825   hydrOXYzine (ATARAX) tablet 25 mg  25 mg Oral Q6H PRN Winfred Leeds, Nephi Savage, MD   25 mg at 01/06/22 2132   ibuprofen (ADVIL) tablet 600 mg  600 mg Oral Q8H PRN Nelda Marseille, Amy E, MD   600 mg at 01/06/22  2030   lidocaine (LIDODERM) 5 % 1 patch  1 patch Transdermal Q24H Harlow Asa, MD   1 patch at 01/06/22 2035   loratadine (CLARITIN) tablet 10 mg  10 mg Oral Daily  Laretta Bolster, FNP   10 mg at 01/07/22 0825   OLANZapine zydis (ZYPREXA) disintegrating tablet 5 mg  5 mg Oral Q8H PRN Harlow Asa, MD       And   LORazepam (ATIVAN) tablet 1 mg  1 mg Oral PRN Harlow Asa, MD       And   ziprasidone (GEODON) injection 20 mg  20 mg Intramuscular PRN Harlow Asa, MD       losartan (COZAAR) tablet 25 mg  25 mg Oral Daily Nelda Marseille, Amy E, MD   25 mg at 01/07/22 0825   multivitamin with minerals tablet 1 tablet  1 tablet Oral Daily Harlow Asa, MD   1 tablet at 01/07/22 0825   nicotine polacrilex (NICORETTE) gum 2 mg  2 mg Oral PRN Harlow Asa, MD   2 mg at 01/07/22 0652   OLANZapine zydis (ZYPREXA) disintegrating tablet 10 mg  10 mg Oral QHS Nelda Marseille, Amy E, MD   10 mg at 01/06/22 2132   OLANZapine zydis (ZYPREXA) disintegrating tablet 5 mg  5 mg Oral Daily Macaela Presas, MD   5 mg at 01/07/22 0824   pantoprazole (PROTONIX) EC tablet 40 mg  40 mg Oral Daily Bobbitt, Shalon E, NP   40 mg at 01/07/22 0825   thiamine tablet 100 mg  100 mg Oral Daily Nelda Marseille, Amy E, MD   100 mg at 01/07/22 0825   traZODone (DESYREL) tablet 50 mg  50 mg Oral QHS PRN Harlow Asa, MD   50 mg at 01/06/22 2132    Lab Results:  No results found for this or any previous visit (from the past 30 hour(s)).   Blood Alcohol level:  Lab Results  Component Value Date   ETH <10 12/30/2021   ETH <10 123XX123    Metabolic Disorder Labs: Lab Results  Component Value Date   HGBA1C 5.4 01/03/2022   MPG 108.28 01/03/2022   No results found for: PROLACTIN Lab Results  Component Value Date   CHOL 161 01/03/2022   TRIG 56 01/03/2022   HDL 62 01/03/2022   CHOLHDL 2.6 01/03/2022   VLDL 11 01/03/2022   LDLCALC 88 01/03/2022   LDLCALC 106 (H) 09/26/2021    Physical Findings: AIMS:  , ,   ,  ,    CIWA:  CIWA-Ar Total: 2 COWS:     Musculoskeletal: Strength & Muscle Tone: within normal limits Gait & Station:  slow gait Patient leans: N/A  Psychiatric Specialty Exam:  Presentation  General Appearance: Appropriate for Environment; Casual  Eye Contact:Minimal  Speech:-- (speaks quickly and hard to understand)  Speech Volume:Normal  Handedness:Right   Mood and Affect  Mood:Anxious; Euthymic  Affect:Congruent   Thought Process  Thought Processes:Goal Directed  Descriptions of Associations:Tangential  Orientation:Full (Time, Place and Person)  Thought Content:Tangential  History of Schizophrenia/Schizoaffective disorder:Yes  Duration of Psychotic Symptoms:No data recorded Hallucinations:Hallucinations: None Description of Auditory Hallucinations: None endorsed at this time  Ideas of Reference:None  Suicidal Thoughts:Suicidal Thoughts: No  Homicidal Thoughts:Homicidal Thoughts: No   Sensorium  Memory:Immediate Fair; Recent Fair; Remote Fair  Judgment:-- (limited as yesterday)  Insight:Lacking   Executive Functions  Concentration:Fair  Attention Span:Fair  [place]   Psychomotor Activity  Psychomotor Activity:Psychomotor Activity: Normal   Assets  Assets:Physical Health; Resilience   Sleep  Sleep:Sleep: Good    Physical Exam: Physical Exam Vitals and nursing note reviewed.  Constitutional:      Appearance:  Normal appearance.  HENT:     Head: Normocephalic.  Pulmonary:     Effort: Pulmonary effort is normal.  Skin:    Findings: Rash present.     Comments: Rash improved since yesterday.  Neurological:     General: No focal deficit present.     Mental Status: He is alert and oriented to person, place, and time.   Review of Systems  Eyes:        Continues to report issues with sight after a past stroke   Gastrointestinal:  Negative for abdominal pain, constipation,  diarrhea, nausea and vomiting.  Neurological:  Negative for dizziness and headaches.  Psychiatric/Behavioral:  Negative for depression, hallucinations and suicidal ideas. The patient is nervous/anxious. The patient does not have insomnia.   Blood pressure (!) 123/95, pulse (!) 102, temperature 97.6 F (36.4 C), temperature source Oral, resp. rate 17, height 5\' 7"  (1.702 m), weight 71.3 kg, SpO2 99 %. Body mass index is 24.62 kg/m.   Treatment Plan Summary: Unspecified schizophrenia spectrum and other psychotic d/o (r/o SIPD, r/o schizoaffective d/o, r/o bipolar mania MRE severe with psychotic features) Stimulant use d/o - amphetamine type R/o alcohol use d/o H/o CVA Genital herpes Tick bite HTN Chronic low back pain with h/o L1 compression fracture   PLAN: Safety and Monitoring:             -- Involuntary admission to inpatient psychiatric unit for safety, stabilization and treatment             -- Daily contact with patient to assess and evaluate symptoms and progress in treatment             -- Patient's case to be discussed in multi-disciplinary team meeting             -- Observation Level : q15 minute checks             -- Vital signs:  q12 hours             -- Precautions: suicide, elopement, and assault   2. Psychiatric Diagnoses and Treatment:              Unspecified schizophrenia spectrum and other psychotic d/o (r/o SIPD, r/o schizoaffective d/o, r/o bipolar mania MRE severe with psychotic features)             -- Continue Zyprexa zydis 10mg   qhs tonight and monitor for symptom improvement as he metabolizes amphetamines in his system -- Zyprexa 5mg  added daily since 5/31, tolerating it well, will continue Lipid Panel: WNL other than LDL 106; HbgA1c: 5.4; QTc:457ms             -- Cogentin 0.5mg  PRN EPS             -- Vistaril 25mg  q6 hours PRN anxiety             -- Trazodone 50mg  and will schedule tonight for help with sleep             -- Agitation protocol  zyprexa/ativan/Geodon             -- Attempting to get collateral from sister Manuela Schwartz [phone]                 -- Encouraged patient to participate in unit milieu and in scheduled group therapies                           Stimulant use  d/o - amphetamine type             R/o alcohol use d/o             -- patient was counseled and he agreed to comply with recommendation to abstain from unprescribed meds or illicit drug use after dc     3. Medical Issues Being Addressed:              HTN             -- Continue home Diltiazem 180mg  daily              -- Hold Lasix due to drop in Na+ to 132             -- Clonidine 0.1mg  PRN SBP >160 or DBP >110              -- Have consulted hospitalist for additional BP med recommendations               Chronic back pain             -- Continue Lidoderm patch              -- No opiates given addiction history (Per PDMP has not filled opiates since 9/22)             -- Patient clarified having no allergy to Tylenol or Motrin and meds ordered PRN               H/o CVA             -- Continue ASA 81mg  daily               Tick Bite             -- NP spoke to ID who recommends no antibiotic at this time given afebrile             - RMSF pending and lyme panel negative               Genital Herpes             -- NP spoke to ID who recommended restart of Valtrex             -- Restarted Valtrex 1000mg  daily for 5 days and then reassess need for ongoing daily prophylaxis               Leukocytosis - resolved             -- Repeat CBC down to 7.4             -- Afebrile               Mild hyponatremia - resolved             -- Holding lasix and monitoring BMP - repeat Na+ today 137 after Lasix held               Tobacco Use d/o             -- Nicotine gum PRN              Congestion - Continue Claritin 10 mg po daily   4. Discharge Planning:              -- Social work and case management to assist with discharge planning and identification of  hospital follow-up needs prior to discharge             --  Estimated LOS: 5-7 days             -- Discharge Concerns: Need to establish a safety plan; Medication compliance and effectiveness             -- Discharge Goals: Return home with outpatient referrals for mental health follow-up including medication management/psychotherapy  Amelia Jo, Medical Student 01/07/2022, 9:41 AM  Total Time Spent in Direct Patient Care:  I personally spent 35 minutes on the unit in direct patient care. The direct patient care time included face-to-face time with the patient, reviewing the patient's chart, communicating with other professionals, and coordinating care. Greater than 50% of this time was spent in counseling or coordinating care with the patient regarding goals of hospitalization, psycho-education, and discharge planning needs.  I personally was present and performed or re-performed the history, physical exam and medical decision-making activities of this service and have verified that the service and findings are accurately documented in the student's note, as addended by me or notated below: During today's evaluation patient is linear at times but also occasionally disorganized with nonlinear responses she does not appear responding to stimuli and denies SI HI or AVH, no self injures or aggressive behavior noted.  He does not appear sleepy or sedated after morning dose of Zyprexa this morning and hopefully adding Zyprexa will be helpful, will follow-up for today regarding his behavior in the unit and during groups which unfortunately will be limited today secondary to norovirus outbreak.  I will also rechecked the patient's left arm rash, he reports it to be improving,no more itching reported, it looks less red upon exam. D/w pt vistaril scheduled and prn for itching and anxiety.   Kamaron Deskins Winfred Leeds, MD Psychiatrist

## 2022-01-07 NOTE — Progress Notes (Signed)
Patient wants order for ensure. Patient wants to discharge with prescription for ensure.

## 2022-01-07 NOTE — Progress Notes (Signed)
Pt did not attend group. 

## 2022-01-07 NOTE — Progress Notes (Signed)
D:  Patient denied SI and HI, contracts for safety.  Denied A/V hallucinations.   A:  Medications administered per MD orders.  Emotional support and encouragement given patient. R:  Safety maintained with 15 minute checks.  

## 2022-01-07 NOTE — Plan of Care (Signed)
Nurse discussed anxiety, depression and coping skills with patient.  

## 2022-01-07 NOTE — Progress Notes (Signed)
Have been following peripherally. BP has remained stable and relatively well controlled. Agree with current regimen and PRN clonidine as needed. Discussed with RN who states pt seems much improved. No further recs at this time. Will go ahead and sign off. Please do not hesitate to call for any questions

## 2022-01-08 ENCOUNTER — Encounter (HOSPITAL_COMMUNITY): Payer: Self-pay | Admitting: Psychiatry

## 2022-01-08 DIAGNOSIS — F29 Unspecified psychosis not due to a substance or known physiological condition: Principal | ICD-10-CM

## 2022-01-08 DIAGNOSIS — F1510 Other stimulant abuse, uncomplicated: Secondary | ICD-10-CM

## 2022-01-08 MED ORDER — HYDROXYZINE HCL 25 MG PO TABS: 25.0000 mg | ORAL_TABLET | Freq: Two times a day (BID) | ORAL | 0 refills | Status: AC

## 2022-01-08 MED ORDER — OLANZAPINE 10 MG PO TBDP: 10.0000 mg | ORAL_TABLET | Freq: Every day | ORAL | 0 refills | Status: AC

## 2022-01-08 MED ORDER — TRAZODONE HCL 50 MG PO TABS: 50.0000 mg | ORAL_TABLET | Freq: Every evening | ORAL | 0 refills | Status: AC | PRN

## 2022-01-08 MED ORDER — NICOTINE POLACRILEX 2 MG MT GUM: 2.0000 mg | CHEWING_GUM | OROMUCOSAL | 0 refills | Status: AC | PRN

## 2022-01-08 MED ORDER — PANTOPRAZOLE SODIUM 40 MG PO TBEC: 40.0000 mg | DELAYED_RELEASE_TABLET | Freq: Every day | ORAL | 0 refills | Status: AC

## 2022-01-08 MED ORDER — ASPIRIN 81 MG PO TBEC: 81.0000 mg | DELAYED_RELEASE_TABLET | Freq: Every day | ORAL | 0 refills | Status: AC

## 2022-01-08 MED ORDER — LORATADINE 10 MG PO TABS: 10.0000 mg | ORAL_TABLET | Freq: Every day | ORAL | 0 refills | Status: AC

## 2022-01-08 MED ORDER — ADULT MULTIVITAMIN W/MINERALS CH: 1.0000 | ORAL_TABLET | Freq: Every day | ORAL | Status: AC

## 2022-01-08 MED ORDER — LOSARTAN POTASSIUM 25 MG PO TABS: 25.0000 mg | ORAL_TABLET | Freq: Every day | ORAL | 0 refills | Status: AC

## 2022-01-08 MED ORDER — DILTIAZEM HCL ER 180 MG PO CP24: 180.0000 mg | ORAL_CAPSULE | Freq: Every day | ORAL | 0 refills | Status: AC

## 2022-01-08 MED ORDER — OLANZAPINE 5 MG PO TBDP: 5.0000 mg | ORAL_TABLET | Freq: Every day | ORAL | 0 refills | Status: AC
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
## 2022-01-08 NOTE — Progress Notes (Signed)
Pt discharged to lobby. Pt was stable and appreciative at that time. All papers and prescriptions were given and valuables returned. Verbal understanding expressed. Denies SI/HI and A/VH. Pt given opportunity to express concerns and ask questions.  
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
## 2022-01-08 NOTE — Progress Notes (Signed)
  [facility] Adult Case Management Discharge Plan :  Will you be returning to the same living situation after discharge:  Yes,  Patient to return to place of residence where he lives alone; Patient's sister lives across the street.  At discharge, do you have transportation home?: Yes,  Patient to be provided with cab voucher for transportation from hospital.  Do you have the ability to pay for your medications: Yes,  [place] Medicaid.   Release of information consent forms completed and in the chart;  Patient's signature needed at discharge.  Patient to Follow up at:  Follow-up Information     [facility]. Go on 01/13/2022.   Why: You have a hospital follow up appointment on 01/13/22 at 9:00 am for therapy and medication management services.  This will be held in person.   Please bring photo ID, social security card (if available), and proof of any income. Contact information: [address] [place] [address] [phone]                 Next level of care provider has access to [facility] Link:no  Safety Planning and Suicide Prevention discussed: Yes,  SPE completed with patient, CSW made multiple attempts to reach friend Landis Gandy in order to complete SPE with family/friend. Unable to reach friend, CSW has not received any missed calls from friend.    Has patient been referred to the Quitline?: Yes, faxed on 01/08/2022 Tobacco Use: High Risk   Smoking Tobacco Use: Every Day   Smokeless Tobacco Use: Never   Passive Exposure: Not on file    Patient has been referred for addiction treatment: N/A Patient denies active substance, last reported use 02/2018. Alcohol Level    Component Value Date/Time   [facility] <10 12/30/2021 2026   Social History   Substance and Sexual Activity  Alcohol Use Yes   Alcohol/week: 1.0 - 2.0 standard drink   Types: 1 - 2 Cans of beer per week   Comment: occ   Social History   Substance and Sexual Activity  Drug Use Not Currently    Types: Oxycodone, Hydrocodone, Heroin, Nitrous oxide   Comment:  pt denies use 02-08-18    Corky Crafts, LCSWA 01/08/2022, 10:24 AM

## 2022-01-08 NOTE — Group Note (Signed)
Recreation Therapy Group Note   Group Topic:Stress Management  Group Date: 01/08/2022 Start Time: 0935 End Time: 0955 Facilitators: Victorino Sparrow, [place] Location: [address] Dayroom   Goal Area(s) Addresses:  Patient will identify positive stress management techniques. Patient will identify benefits of using stress management post d/c.  Group Description:  Meditation.  LRT played a meditation for patients that focused on finding inner peace for different areas of their lives.  Patients were to listen and focus on the meditation to fully engage in the activity.  Patients were made aware of other places to find stress management techniques such as Youtube, Apps, etc.    Affect/Mood: Labile   Participation Level: Hyperverbal   Participation Quality: Maximum Cues   Behavior: Disruptive   Speech/Thought Process: Distracted   Insight: Lacking   Judgement: Lacking    Modes of Intervention: Meditation, Ambient Sounds   Patient Response to Interventions:  Disengaged   Education Outcome:  In group clarification offered    Clinical Observations/Individualized Feedback: Pt came to group late.  Pt was loud, talking about the coffee.  Pt was redirected to be quiet.  Pt then became loud asking about the movie paused on the television and again needed redirection.  Pt was disruptive and needed constant redirection.    Plan: Continue to engage patient in RT group sessions 2-3x/week.   Victorino Sparrow, LRT,CTRS 01/08/2022 11:25 AM

## 2022-01-08 NOTE — Progress Notes (Signed)
   01/07/22 2100  Psych Admission Type (Psych Patients Only)  Admission Status Involuntary  Psychosocial Assessment  Patient Complaints Anxiety  Eye Contact Fair  Facial Expression Animated  Affect Appropriate to circumstance  Speech Logical/coherent  Interaction Assertive  Motor Activity Slow  Appearance/Hygiene Unremarkable  Behavior Characteristics Appropriate to situation  Mood Anxious  Thought Process  Coherency WDL  Content WDL  Delusions None reported or observed  Perception WDL  Hallucination None reported or observed  Judgment Impaired  Confusion None  Danger to Self  Current suicidal ideation? Denies  Danger to Others  Danger to Others None reported or observed

## 2022-05-01 ENCOUNTER — Ambulatory Visit (INDEPENDENT_AMBULATORY_CARE_PROVIDER_SITE_OTHER): Payer: Medicaid Other

## 2022-05-01 ENCOUNTER — Ambulatory Visit
Admission: EM | Admit: 2022-05-01 | Discharge: 2022-05-01 | Disposition: A | Discharge status: Home / Self Care | Payer: Medicaid Other | Attending: Family Medicine | Admitting: Family Medicine

## 2022-05-01 ENCOUNTER — Encounter: Payer: Self-pay | Admitting: Emergency Medicine

## 2022-05-01 DIAGNOSIS — M25561 Pain in right knee: Secondary | ICD-10-CM

## 2022-05-01 DIAGNOSIS — W19XXXA Unspecified fall, initial encounter: Secondary | ICD-10-CM | POA: Diagnosis not present

## 2022-05-01 DIAGNOSIS — S2232XA Fracture of one rib, left side, initial encounter for closed fracture: Secondary | ICD-10-CM | POA: Diagnosis not present

## 2022-05-01 DIAGNOSIS — R0782 Intercostal pain: Secondary | ICD-10-CM

## 2022-05-01 MED ORDER — HYDROCODONE-ACETAMINOPHEN 5-325 MG PO TABS: 1.0000 | ORAL_TABLET | Freq: Every evening | ORAL | 0 refills | Status: AC | PRN

## 2022-05-01 MED ORDER — CYCLOBENZAPRINE HCL 10 MG PO TABS: 10.0000 mg | ORAL_TABLET | Freq: Three times a day (TID) | ORAL | 0 refills | Status: AC | PRN

## 2022-05-01 NOTE — Discharge Instructions (Signed)
Your knee x-ray today showed no bony injury, use ice, elevation, Tylenol as needed for the knee pain.  Your chest and rib film today showed at least 1 new fracture to the left ribs and recommended a repeat x-ray in the next 1 to 2 weeks for a recheck.  Follow-up with your primary care provider for this.  If you have any difficulty breathing at any time, go directly to the emergency department.  I have sent over medication for pain control and muscle relaxation and you can use heat, lidocaine patches additionally.
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
